# Patient Record
Sex: Female | Born: 1985 | Race: White | Hispanic: No | Marital: Married | Smoking: Former smoker
Health system: Southern US, Community
[De-identification: ages and names within clinical notes are randomized; demographics above are authoritative.]

## PROBLEM LIST (undated history)

## (undated) DIAGNOSIS — C439 Malignant melanoma of skin, unspecified: Secondary | ICD-10-CM

## (undated) DIAGNOSIS — R55 Syncope and collapse: Secondary | ICD-10-CM

## (undated) DIAGNOSIS — G43909 Migraine, unspecified, not intractable, without status migrainosus: Secondary | ICD-10-CM

## (undated) DIAGNOSIS — B019 Varicella without complication: Secondary | ICD-10-CM

## (undated) HISTORY — DX: Malignant melanoma of skin, unspecified: C43.9

## (undated) HISTORY — DX: Syncope and collapse: R55

## (undated) HISTORY — DX: Varicella without complication: B01.9

## (undated) HISTORY — DX: Migraine, unspecified, not intractable, without status migrainosus: G43.909

## (undated) HISTORY — PX: CHOLECYSTECTOMY: SHX55

---
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
## 2005-07-07 ENCOUNTER — Emergency Department: Payer: Self-pay | Admitting: Internal Medicine

## 2005-07-14 ENCOUNTER — Emergency Department: Payer: Self-pay | Admitting: General Practice

## 2005-09-07 ENCOUNTER — Emergency Department: Payer: Self-pay | Admitting: Emergency Medicine

## 2006-01-24 ENCOUNTER — Observation Stay: Payer: Self-pay | Admitting: Obstetrics & Gynecology

## 2006-01-29 ENCOUNTER — Observation Stay: Payer: Self-pay | Admitting: Obstetrics and Gynecology

## 2006-02-06 ENCOUNTER — Inpatient Hospital Stay: Payer: Self-pay | Admitting: Obstetrics & Gynecology

## 2006-11-17 ENCOUNTER — Emergency Department: Payer: Self-pay | Admitting: Emergency Medicine

## 2006-12-26 ENCOUNTER — Ambulatory Visit: Payer: Self-pay | Admitting: Obstetrics and Gynecology

## 2007-02-09 ENCOUNTER — Emergency Department: Payer: Self-pay

## 2007-06-12 ENCOUNTER — Ambulatory Visit: Payer: Self-pay | Admitting: Obstetrics and Gynecology

## 2007-06-13 ENCOUNTER — Inpatient Hospital Stay: Payer: Self-pay | Admitting: Obstetrics and Gynecology

## 2007-06-23 ENCOUNTER — Other Ambulatory Visit: Payer: Self-pay

## 2007-06-23 ENCOUNTER — Emergency Department: Payer: Self-pay | Admitting: Internal Medicine

## 2008-04-11 ENCOUNTER — Emergency Department: Payer: Self-pay | Admitting: Emergency Medicine

## 2008-12-18 ENCOUNTER — Emergency Department: Payer: Self-pay | Admitting: Internal Medicine

## 2011-11-26 ENCOUNTER — Ambulatory Visit (INDEPENDENT_AMBULATORY_CARE_PROVIDER_SITE_OTHER): Payer: Self-pay | Admitting: Internal Medicine

## 2011-11-26 ENCOUNTER — Encounter: Payer: Self-pay | Admitting: Internal Medicine

## 2011-11-26 VITALS — BP 110/80 | HR 64 | Temp 98.8°F | Ht 66.5 in | Wt 155.5 lb

## 2011-11-26 DIAGNOSIS — N926 Irregular menstruation, unspecified: Secondary | ICD-10-CM

## 2011-11-26 DIAGNOSIS — R002 Palpitations: Secondary | ICD-10-CM

## 2011-11-26 DIAGNOSIS — N920 Excessive and frequent menstruation with regular cycle: Secondary | ICD-10-CM

## 2011-11-26 DIAGNOSIS — Z0000 Encounter for general adult medical examination without abnormal findings: Secondary | ICD-10-CM

## 2011-11-26 NOTE — Assessment & Plan Note (Signed)
Patient having some palpitations at night. Exam and EKG are normal today. Will check TSH with labs. Will also check electrolytes given recent significant weight loss.

## 2011-11-26 NOTE — Progress Notes (Signed)
Subjective:    Patient ID: Angela Banks, female    DOB: January 30, 1986, 26 y.o.   MRN: [id number]  HPI 26 year old female presents to establish care. She reports that she has generally been healthy. She notes that over the last few months she has had some episodes of feeling lightheaded. She also notes some palpitations mostly at night time. These are brief and are not accompanied by shortness of breath, chest pain, or other symptoms. She notes that over the last 6 months as she has lost approximately 80 pounds. She has been following a healthy diet and has been exercising daily. She also notes some irregular menstrual cycles over the last few months. She notes that her periods are extremely heavy, typically requiring the use of both a tampon and pad every hour. Prior to her irregular menstrual cycles recently, her periods have been irregular. She denies symptoms such as hot flashes, vaginal dryness, fatigue.  No outpatient encounter prescriptions on file as of 11/26/2011.    Review of Systems  Constitutional: Negative for fever, chills, appetite change, fatigue and unexpected weight change.  HENT: Negative for ear pain, congestion, sore throat, trouble swallowing, neck pain, voice change and sinus pressure.   Eyes: Negative for visual disturbance.  Respiratory: Negative for cough, shortness of breath, wheezing and stridor.   Cardiovascular: Positive for palpitations. Negative for chest pain and leg swelling.  Gastrointestinal: Negative for nausea, vomiting, abdominal pain, diarrhea, constipation, blood in stool, abdominal distention and anal bleeding.  Genitourinary: Positive for menstrual problem. Negative for dysuria and flank pain.  Musculoskeletal: Negative for myalgias, arthralgias and gait problem.  Skin: Negative for color change and rash.  Neurological: Positive for light-headedness. Negative for dizziness and headaches.  Hematological: Negative for adenopathy. Does not bruise/bleed  easily.  Psychiatric/Behavioral: Negative for suicidal ideas, disturbed wake/sleep cycle and dysphoric mood. The patient is not nervous/anxious.    BP 110/80  Pulse 64  Temp 98.8 F (37.1 C) (Oral)  Ht 5' 6.5" (1.689 m)  Wt 155 lb 8 oz (70.534 kg)  BMI 24.72 kg/m2  SpO2 98%  LMP 09/19/2011     Objective:   Physical Exam  Constitutional: She is oriented to person, place, and time. She appears well-developed and well-nourished. No distress.  HENT:  Head: Normocephalic and atraumatic.  Right Ear: External ear normal.  Left Ear: External ear normal.  Nose: Nose normal.  Mouth/Throat: Oropharynx is clear and moist. No oropharyngeal exudate.  Eyes: Conjunctivae are normal. Pupils are equal, round, and reactive to light. Right eye exhibits no discharge. Left eye exhibits no discharge. No scleral icterus.  Neck: Normal range of motion. Neck supple. No tracheal deviation present. No thyromegaly present.  Cardiovascular: Normal rate, regular rhythm, normal heart sounds and intact distal pulses.  Exam reveals no gallop and no friction rub.   No murmur heard. Pulmonary/Chest: Effort normal and breath sounds normal. No respiratory distress. She has no wheezes. She has no rales. She exhibits no tenderness.  Abdominal: Soft. Bowel sounds are normal. She exhibits no distension and no mass. There is no tenderness. There is no guarding.  Musculoskeletal: Normal range of motion. She exhibits no edema and no tenderness.  Lymphadenopathy:    She has no cervical adenopathy.  Neurological: She is alert and oriented to person, place, and time. No cranial nerve deficit. She exhibits normal muscle tone. Coordination normal.  Skin: Skin is warm and dry. No rash noted. She is not diaphoretic. No erythema. No pallor.  Psychiatric: She has a normal  mood and affect. Her behavior is normal. Judgment and thought content normal.          Assessment & Plan:

## 2011-11-26 NOTE — Assessment & Plan Note (Signed)
Patient having extremely heavy menstrual cycles. Cycles also irregular. As above, checking TSH, LH, FSH. Will also get ultrasound of the pelvis to evaluate for uterine fibroid. Will check CBC and ferritin with labs.

## 2011-11-26 NOTE — Assessment & Plan Note (Signed)
Menses irregular. Question if this may be related to recent significant weight loss. Alternative considerations would be thyroid dysfunction. Will check TSH, LH, FSH with labs.

## 2011-11-27 LAB — LUTEINIZING HORMONE: LH: 13.31 m[IU]/mL

## 2011-11-27 LAB — CBC WITH DIFFERENTIAL/PLATELET
Basophils Relative: 0.2 % (ref 0.0–3.0)
Eosinophils Absolute: 0.1 10*3/uL (ref 0.0–0.7)
Hemoglobin: 12.8 g/dL (ref 12.0–15.0)
Lymphocytes Relative: 30.9 % (ref 12.0–46.0)
MCHC: 33.7 g/dL (ref 30.0–36.0)
Monocytes Relative: 6.7 % (ref 3.0–12.0)
Neutro Abs: 3.5 10*3/uL (ref 1.4–7.7)
RBC: 4.07 Mil/uL (ref 3.87–5.11)

## 2011-11-27 LAB — COMPREHENSIVE METABOLIC PANEL
BUN: 17 mg/dL (ref 6–23)
CO2: 26 mEq/L (ref 19–32)
Calcium: 9.2 mg/dL (ref 8.4–10.5)
Chloride: 105 mEq/L (ref 96–112)
Creatinine, Ser: 0.7 mg/dL (ref 0.4–1.2)
GFR: 103.85 mL/min (ref >60.00)

## 2011-11-27 LAB — FERRITIN: Ferritin: 13.7 ng/mL (ref 10.0–291.0)

## 2011-11-27 LAB — LIPID PANEL
Cholesterol: 167 mg/dL (ref 0–200)
Triglycerides: 48 mg/dL (ref 0.0–149.0)

## 2011-11-30 ENCOUNTER — Ambulatory Visit: Payer: Self-pay | Admitting: Internal Medicine

## 2011-12-03 ENCOUNTER — Telehealth: Payer: Self-pay | Admitting: Internal Medicine

## 2011-12-03 NOTE — Telephone Encounter (Signed)
Ultrasound of the pelvis was normal except for a small 1.9 cm ovarian cyst on the right. No findings to explain irregular menses.

## 2011-12-03 NOTE — Telephone Encounter (Signed)
Patient advised as instructed via telephone, she will talk with Dr. Dan Humphreys more again on Friday at her follow up appt.

## 2011-12-07 ENCOUNTER — Ambulatory Visit: Payer: 59 | Admitting: Internal Medicine

## 2011-12-07 DIAGNOSIS — Z0289 Encounter for other administrative examinations: Secondary | ICD-10-CM

## 2012-02-08 ENCOUNTER — Ambulatory Visit: Payer: Self-pay | Admitting: Internal Medicine

## 2012-02-08 LAB — LIPASE, BLOOD: Lipase: 84 U/L (ref 73–393)

## 2012-02-08 LAB — CBC WITH DIFFERENTIAL/PLATELET
Basophil #: 0 10*3/uL (ref 0.0–0.1)
Eosinophil #: 0.1 10*3/uL (ref 0.0–0.7)
HCT: 38.2 % (ref 35.0–47.0)
HGB: 12.7 g/dL (ref 12.0–16.0)
Lymphocyte #: 2.1 10*3/uL (ref 1.0–3.6)
Monocyte #: 0.7 x10 3/mm (ref 0.2–0.9)
Neutrophil #: 4.2 10*3/uL (ref 1.4–6.5)
RBC: 4.06 10*6/uL (ref 3.80–5.20)

## 2012-02-08 LAB — URINALYSIS, COMPLETE
Bilirubin,UR: NEGATIVE
Glucose,UR: NEGATIVE mg/dL (ref 0–75)
Ketone: NEGATIVE
Leukocyte Esterase: NEGATIVE
Nitrite: NEGATIVE
Specific Gravity: >=1.03 (ref 1.003–1.030)
WBC UR: NONE SEEN /HPF (ref 0–5)

## 2012-02-08 LAB — COMPREHENSIVE METABOLIC PANEL
Albumin: 4 g/dL (ref 3.4–5.0)
Alkaline Phosphatase: 75 U/L (ref 50–136)
BUN: 13 mg/dL (ref 7–18)
Glucose: 85 mg/dL (ref 65–99)
Potassium: 4 mmol/L (ref 3.5–5.1)
SGOT(AST): 13 U/L — ABNORMAL LOW (ref 15–37)
SGPT (ALT): 25 U/L (ref 12–78)
Sodium: 139 mmol/L (ref 136–145)
Total Protein: 7.5 g/dL (ref 6.4–8.2)

## 2012-02-08 LAB — PREGNANCY, URINE: Pregnancy Test, Urine: NEGATIVE m[IU]/mL

## 2015-05-19 ENCOUNTER — Encounter: Payer: Self-pay | Admitting: Family

## 2015-05-19 ENCOUNTER — Ambulatory Visit: Payer: Self-pay | Admitting: Family

## 2015-05-19 VITALS — BP 100/70 | HR 67 | Temp 98.4°F

## 2015-05-19 DIAGNOSIS — J309 Allergic rhinitis, unspecified: Secondary | ICD-10-CM

## 2015-05-19 DIAGNOSIS — J329 Chronic sinusitis, unspecified: Secondary | ICD-10-CM

## 2015-05-19 MED ORDER — AZITHROMYCIN 250 MG PO TABS: ORAL_TABLET | ORAL | Status: DC

## 2015-05-19 MED ORDER — FLUTICASONE PROPIONATE 50 MCG/ACT NA SUSP: 2.0000 | Freq: Every day | NASAL | Status: DC

## 2015-05-19 MED ORDER — PREDNISONE 20 MG PO TABS: 40.0000 mg | ORAL_TABLET | Freq: Every day | ORAL | Status: DC

## 2015-05-19 NOTE — Progress Notes (Signed)
S/ 2 week hx of nasal congestion , seemed allergic as  started after going through Old stuff at Grandmothers house , now with severe sinus headache ,malaise , left max worse and left eye watering , not mattered. No fever or GI sxs Coworkers sick  O/ VSS alert pleasant , NAD ent  Nasal turbinates swollen , red occluded , left max tenderness , left conjunctiva injected and with watering , PERRLA   Pharynx clear neck supple heart rsr lungs clear A/ allergic rhinitis ,sinusitis  P/ Zpack, pred pulse, flonase rx sent supportive measures , rest hydrate .

## 2015-08-04 ENCOUNTER — Encounter: Payer: Self-pay | Admitting: Physician Assistant

## 2015-08-04 ENCOUNTER — Ambulatory Visit: Payer: Self-pay | Admitting: Physician Assistant

## 2015-08-04 VITALS — BP 110/60 | HR 73 | Temp 97.8°F

## 2015-08-04 DIAGNOSIS — B349 Viral infection, unspecified: Secondary | ICD-10-CM

## 2015-08-04 DIAGNOSIS — A084 Viral intestinal infection, unspecified: Secondary | ICD-10-CM

## 2015-08-04 NOTE — Progress Notes (Signed)
S:  Pt c/o sore throat and diarrhea, sx for 1 day, no fever/chills, no abd pain except for cramping with diarrhea; denies cp/sob, denies camping, bad food, recent antibiotics, or exposure to bad water, children have been sick with the same Remainder ros neg  O:  Vitals wnl, nad, ENT wnl, neck supple no lymph, lungs c t a, cv rrr, abd soft nontender bs normal all 4 quads, neuro intact  A:  Viral gastroenteritis, viral illness  P:  Reassurance, fluids, brat diet, immodium ad for diarrhea if needed, rx phenergan 25mg  tid prn vomiting, return if not better in 3 days, return earlier if worsening

## 2015-08-19 ENCOUNTER — Other Ambulatory Visit: Payer: Self-pay

## 2016-05-15 ENCOUNTER — Ambulatory Visit: Payer: Self-pay | Admitting: Physician Assistant

## 2016-05-15 ENCOUNTER — Encounter: Payer: Self-pay | Admitting: Physician Assistant

## 2016-05-15 VITALS — BP 127/81 | HR 93 | Temp 98.2°F

## 2016-05-15 DIAGNOSIS — J069 Acute upper respiratory infection, unspecified: Secondary | ICD-10-CM

## 2016-05-15 LAB — POCT INFLUENZA A/B
INFLUENZA B, POC: NEGATIVE
Influenza A, POC: NEGATIVE

## 2016-05-15 MED ORDER — HYDROCOD POLST-CPM POLST ER 10-8 MG/5ML PO SUER: 5.0000 mL | Freq: Two times a day (BID) | ORAL | 0 refills | Status: DC | PRN

## 2016-05-15 NOTE — Progress Notes (Signed)
S: C/o runny nose and congestion with dry cough for 3 days, + fever, chills that started yesterday, denies cp/sob, v/d; mucus was green this am , cough is sporadic, deep and hacking, keeping her awake at night  Using otc meds: nyquil  O: PE: vitals wnl, nad,  perrl eomi, normocephalic, tms dull, nasal mucosa red and swollen, throat injected, neck supple no lymph, lungs c t a, cv rrr, neuro intact, flu swab neg  A:  Acute flu like illness   P: drink fluids, continue regular meds , use otc meds of choice, return if not improving in 5 days, return earlier if worsening , tussionex, zpack, pred 30mg  qd x 3d

## 2016-05-17 ENCOUNTER — Encounter: Payer: Self-pay | Admitting: Physician Assistant

## 2016-05-17 ENCOUNTER — Ambulatory Visit: Payer: Self-pay | Admitting: Physician Assistant

## 2016-05-17 VITALS — BP 121/59 | HR 78 | Temp 97.7°F

## 2016-05-17 DIAGNOSIS — B349 Viral infection, unspecified: Secondary | ICD-10-CM

## 2016-05-17 NOTE — Progress Notes (Signed)
S:  Pt c/o vomiting, sx for 1 day, no fever/chills, no abd pain except for cramping with diarrhea; denies cp/sob, denies camping, bad food, or exposure to bad water Remainder ros neg, been on zpack for uri, takes last pill tomorrow, still has headache and pressure, no diarrhea yet, kids have norovirus  O:  Vitals wnl, nad, ENT wnl, neck supple no lymph, lungs c t a, cv rrr, abd soft nontender bs increased lower quads b/l, neuro intact  A:  Viral illness  P:  Reassurance, fluids, brat diet, immodium ad for diarrhea if needed, return if not better in 3 days, return earlier if worsening, work note given for yesterday and today as pt appears ill

## 2016-10-08 ENCOUNTER — Ambulatory Visit: Payer: Self-pay | Admitting: Physician Assistant

## 2016-10-08 ENCOUNTER — Encounter: Payer: Self-pay | Admitting: Physician Assistant

## 2016-10-08 ENCOUNTER — Encounter (INDEPENDENT_AMBULATORY_CARE_PROVIDER_SITE_OTHER): Payer: Self-pay

## 2016-10-08 VITALS — BP 110/70 | HR 76 | Temp 98.5°F | Ht 66.0 in | Wt 223.0 lb

## 2016-10-08 DIAGNOSIS — Z008 Encounter for other general examination: Secondary | ICD-10-CM

## 2016-10-08 DIAGNOSIS — Z0000 Encounter for general adult medical examination without abnormal findings: Secondary | ICD-10-CM

## 2016-10-08 DIAGNOSIS — Z0189 Encounter for other specified special examinations: Principal | ICD-10-CM

## 2016-10-08 NOTE — Progress Notes (Signed)
S: pt here for wellness physical and biometrics for insurance purposes, no complaints ros neg. PMH: neg   Social: smoker Fam: as noted on chart  O: vitals wnl, nad, ENT wnl, neck supple no lymph, lungs c t a, cv rrr, abd soft nontender bs normal all 4 quads  A: wellness, biometric physical  P: labs today, will forward to dr Toya Smothersknowles jonas as she was suppose to get labs via her but did not get it done

## 2016-10-09 LAB — CMP12+LP+TP+TSH+6AC+CBC/D/PLT
A/G RATIO: 1.9 (ref 1.2–2.2)
ALT: 17 IU/L (ref 0–32)
AST: 16 IU/L (ref 0–40)
Albumin: 4.5 g/dL (ref 3.5–5.5)
Alkaline Phosphatase: 85 IU/L (ref 39–117)
BUN/Creatinine Ratio: 13 (ref 9–23)
BUN: 10 mg/dL (ref 6–20)
Basophils Absolute: 0 10*3/uL (ref 0.0–0.2)
Basos: 1 %
Bilirubin Total: 0.3 mg/dL (ref 0.0–1.2)
CHOL/HDL RATIO: 2.7 ratio (ref 0.0–4.4)
CREATININE: 0.77 mg/dL (ref 0.57–1.00)
Calcium: 9.2 mg/dL (ref 8.7–10.2)
Chloride: 104 mmol/L (ref 96–106)
Cholesterol, Total: 156 mg/dL (ref 100–199)
EOS (ABSOLUTE): 0.2 10*3/uL (ref 0.0–0.4)
Eos: 3 %
Estimated CHD Risk: <0.5 times avg. (ref 0.0–1.0)
Free Thyroxine Index: 2.1 (ref 1.2–4.9)
GFR, EST AFRICAN AMERICAN: 119 mL/min/{1.73_m2} (ref >59)
GFR, EST NON AFRICAN AMERICAN: 103 mL/min/{1.73_m2} (ref >59)
GGT: 14 IU/L (ref 0–60)
GLOBULIN, TOTAL: 2.4 g/dL (ref 1.5–4.5)
GLUCOSE: 101 mg/dL — AB (ref 65–99)
HDL: 58 mg/dL (ref >39)
Hematocrit: 42.8 % (ref 34.0–46.6)
Hemoglobin: 13.6 g/dL (ref 11.1–15.9)
IMMATURE GRANS (ABS): 0 10*3/uL (ref 0.0–0.1)
IRON: 59 ug/dL (ref 27–159)
Immature Granulocytes: 0 %
LDH: 212 IU/L (ref 119–226)
LDL Calculated: 76 mg/dL (ref 0–99)
LYMPHS ABS: 2.1 10*3/uL (ref 0.7–3.1)
Lymphs: 33 %
MCH: 29.6 pg (ref 26.6–33.0)
MCHC: 31.8 g/dL (ref 31.5–35.7)
MCV: 93 fL (ref 79–97)
MONOS ABS: 0.5 10*3/uL (ref 0.1–0.9)
Monocytes: 7 %
NEUTROS ABS: 3.6 10*3/uL (ref 1.4–7.0)
Neutrophils: 56 %
PHOSPHORUS: 3.6 mg/dL (ref 2.5–4.5)
POTASSIUM: 4.2 mmol/L (ref 3.5–5.2)
Platelets: 258 10*3/uL (ref 150–379)
RBC: 4.59 x10E6/uL (ref 3.77–5.28)
RDW: 13.2 % (ref 12.3–15.4)
SODIUM: 139 mmol/L (ref 134–144)
T3 Uptake Ratio: 26 % (ref 24–39)
T4 TOTAL: 7.9 ug/dL (ref 4.5–12.0)
TSH: 1.61 u[IU]/mL (ref 0.450–4.500)
Total Protein: 6.9 g/dL (ref 6.0–8.5)
Triglycerides: 111 mg/dL (ref 0–149)
URIC ACID: 5.3 mg/dL (ref 2.5–7.1)
VLDL Cholesterol Cal: 22 mg/dL (ref 5–40)
WBC: 6.4 10*3/uL (ref 3.4–10.8)

## 2016-10-09 LAB — VITAMIN D 25 HYDROXY (VIT D DEFICIENCY, FRACTURES): Vit D, 25-Hydroxy: 42.5 ng/mL (ref 30.0–100.0)

## 2017-02-15 ENCOUNTER — Encounter: Payer: Self-pay | Admitting: Physician Assistant

## 2017-02-15 ENCOUNTER — Ambulatory Visit: Payer: Self-pay | Admitting: Physician Assistant

## 2017-02-15 VITALS — BP 110/70 | HR 72 | Temp 97.9°F | Resp 16

## 2017-02-15 DIAGNOSIS — R51 Headache: Secondary | ICD-10-CM

## 2017-02-15 DIAGNOSIS — J0100 Acute maxillary sinusitis, unspecified: Secondary | ICD-10-CM

## 2017-02-15 DIAGNOSIS — R519 Headache, unspecified: Secondary | ICD-10-CM

## 2017-02-15 MED ORDER — PREDNISONE 10 MG PO TABS: 30.0000 mg | ORAL_TABLET | Freq: Every day | ORAL | 0 refills | Status: DC

## 2017-02-15 MED ORDER — AZITHROMYCIN 250 MG PO TABS: ORAL_TABLET | ORAL | 0 refills | Status: DC

## 2017-02-15 NOTE — Progress Notes (Signed)
S: C/o runny nose and congestion for 3 days, no fever, chills, cp/sob, v/d; mucus is green and thick, cough is sporadic, c/o of facial and dental pain. Pressure behind eyes, sinus headache  Using otc meds: nyquil, cold meds  O: PE: vitals wnl, nad, perrl eomi, normocephalic, tms dull, nasal mucosa red and swollen, throat injected, neck supple no lymph, lungs c t a, cv rrr, neuro intact  A:  Acute sinusitis   P: drink fluids, continue regular meds , use otc meds of choice, return if not improving in 5 days, return earlier if worsening , zpack, pred  qd x 3d

## 2017-03-04 ENCOUNTER — Telehealth: Payer: Self-pay | Admitting: Emergency Medicine

## 2017-03-04 MED ORDER — FLUCONAZOLE 150 MG PO TABS: ORAL_TABLET | ORAL | 0 refills | Status: DC

## 2017-03-04 NOTE — Telephone Encounter (Signed)
Patient called and expressed that she has developed a yeast infection due to the antibiotics she was prescribed.

## 2017-03-04 NOTE — Telephone Encounter (Signed)
Pt developed yeast from the antibiotic, sent rx for diflucan to her pharmacy

## 2017-03-22 ENCOUNTER — Ambulatory Visit: Payer: Self-pay | Admitting: Physician Assistant

## 2017-03-22 DIAGNOSIS — Z0289 Encounter for other administrative examinations: Secondary | ICD-10-CM

## 2017-03-22 NOTE — Progress Notes (Signed)
Here for c-com physical, see documentaion in systoc

## 2017-05-20 ENCOUNTER — Encounter: Payer: Self-pay | Admitting: Emergency Medicine

## 2017-05-20 ENCOUNTER — Ambulatory Visit: Payer: Self-pay | Admitting: Emergency Medicine

## 2017-05-20 VITALS — BP 110/70 | HR 69 | Temp 97.7°F | Resp 16

## 2017-05-20 DIAGNOSIS — J0100 Acute maxillary sinusitis, unspecified: Secondary | ICD-10-CM

## 2017-05-20 MED ORDER — PREDNISONE 10 MG (21) PO TBPK: ORAL_TABLET | ORAL | 0 refills | Status: DC

## 2017-05-20 MED ORDER — FLUCONAZOLE 150 MG PO TABS: 150.0000 mg | ORAL_TABLET | Freq: Once | ORAL | 0 refills | Status: AC

## 2017-05-20 MED ORDER — AMOXICILLIN 875 MG PO TABS: 875.0000 mg | ORAL_TABLET | Freq: Two times a day (BID) | ORAL | 0 refills | Status: DC

## 2017-05-20 NOTE — Progress Notes (Signed)
Subjective.  Patient had onset of symptoms on Friday. She has significant head congestion. She has severe facial pressure. She alternates between clear and colored nasal drainage. Her biggest problem has been the pain in her face. Her throat is scratchy. She does have a cough but it is not severe. She denies any severe fever or chills.  Objective.  HEENT exam. Pupils equal and reactive to light with clear conjunctival discharge. Nose is congested no purulence There is significant discomfort with tapping over both maxillary sinuses but worse on the right. TMs are all with fluid. Neck is supple no adenopathy. Chest clear to auscultation and percussion.  Assessment.  Patient has significant nasal congestion with developing sinus infection.  Plan.  Amoxicillin 875 twice a day. Sterapred DS six-day. Patient given Diflucan prescription.

## 2018-07-22 ENCOUNTER — Other Ambulatory Visit: Payer: Self-pay | Admitting: Ophthalmology

## 2018-07-22 DIAGNOSIS — H47033 Optic nerve hypoplasia, bilateral: Secondary | ICD-10-CM

## 2019-03-20 ENCOUNTER — Encounter: Payer: Self-pay | Admitting: Medical

## 2019-03-20 ENCOUNTER — Other Ambulatory Visit: Payer: Self-pay

## 2019-03-20 ENCOUNTER — Ambulatory Visit: Payer: Self-pay | Admitting: Medical

## 2019-03-20 DIAGNOSIS — B373 Candidiasis of vulva and vagina: Secondary | ICD-10-CM

## 2019-03-20 DIAGNOSIS — R05 Cough: Secondary | ICD-10-CM

## 2019-03-20 DIAGNOSIS — B3731 Acute candidiasis of vulva and vagina: Secondary | ICD-10-CM

## 2019-03-20 DIAGNOSIS — J329 Chronic sinusitis, unspecified: Secondary | ICD-10-CM

## 2019-03-20 DIAGNOSIS — R059 Cough, unspecified: Secondary | ICD-10-CM

## 2019-03-20 DIAGNOSIS — J069 Acute upper respiratory infection, unspecified: Secondary | ICD-10-CM

## 2019-03-20 MED ORDER — AMOXICILLIN-POT CLAVULANATE 875-125 MG PO TABS: 1.0000 | ORAL_TABLET | Freq: Two times a day (BID) | ORAL | 0 refills | Status: DC

## 2019-03-20 MED ORDER — FLUCONAZOLE 150 MG PO TABS: 150.0000 mg | ORAL_TABLET | Freq: Once | ORAL | 0 refills | Status: AC

## 2019-03-20 MED ORDER — BENZONATATE 200 MG PO CAPS: 200.0000 mg | ORAL_CAPSULE | Freq: Three times a day (TID) | ORAL | 0 refills | Status: DC | PRN

## 2019-03-20 NOTE — Progress Notes (Signed)
Permission to treat through telemedicine .   Started  Last Tuesday with chills and a scratchy throat and a dry cough. Woke up sweaty that night,  checked temperature 103.5. Tested for Covid on Wednesday and had negative results. ST increasingly worse , head pressure and nasal congestion cough yellow to clear. Deep breaths more difficult.  Wednesday night  102.5 temp. Ear pressure bilaterally .  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 Not pregnant.  No N/V/D.  Cholecystectomy 2006 C-Sec 2007.   PE: none done due to telemedicine appointment.  Dx/Plan  Sinusitis , Upper Respiratory Infection , Cough Yeast vaginitis with antibiotic use Meds ordered this encounter  Medications  . amoxicillin-clavulanate (AUGMENTIN) 875-125 MG tablet    Sig: Take 1 tablet by mouth 2 (two) times daily.    Dispense:  20 tablet    Refill:  0  . benzonatate (TESSALON) 200 MG capsule    Sig: Take 1 capsule (200 mg total) by mouth 3 (three) times daily as needed for cough.    Dispense:  20 capsule    Refill:  0  . fluconazole (DIFLUCAN) 150 MG tablet    Sig: Take 1 tablet (150 mg total) by mouth once for 1 dose. Take after finishing antibiotics. For yeast vaginitis.    Dispense:  1 tablet    Refill:  0  follow up  3-5 days if  Not improving. Work note written and patient should have access to note.  She needs to be fever free x 24 hours and 10 days from symptom onset with symptoms resolved or improving.  Patient verbalizes understanding and has no questions at discharge.

## 2020-03-30 DIAGNOSIS — M5430 Sciatica, unspecified side: Secondary | ICD-10-CM | POA: Diagnosis not present

## 2020-11-15 DIAGNOSIS — M533 Sacrococcygeal disorders, not elsewhere classified: Secondary | ICD-10-CM | POA: Diagnosis not present

## 2021-08-16 DIAGNOSIS — N6452 Nipple discharge: Secondary | ICD-10-CM | POA: Insufficient documentation

## 2021-08-24 ENCOUNTER — Encounter: Payer: Self-pay | Admitting: Family

## 2021-08-24 ENCOUNTER — Ambulatory Visit: Payer: BC Managed Care – PPO | Admitting: Family

## 2021-08-24 ENCOUNTER — Other Ambulatory Visit: Payer: Self-pay | Admitting: Family

## 2021-08-24 ENCOUNTER — Telehealth: Payer: Self-pay | Admitting: Family

## 2021-08-24 VITALS — BP 108/60 | HR 73 | Temp 98.6°F | Resp 16 | Ht 66.0 in | Wt 256.1 lb

## 2021-08-24 DIAGNOSIS — Z13220 Encounter for screening for lipoid disorders: Secondary | ICD-10-CM | POA: Insufficient documentation

## 2021-08-24 DIAGNOSIS — N6311 Unspecified lump in the right breast, upper outer quadrant: Secondary | ICD-10-CM | POA: Insufficient documentation

## 2021-08-24 DIAGNOSIS — E282 Polycystic ovarian syndrome: Secondary | ICD-10-CM | POA: Diagnosis not present

## 2021-08-24 DIAGNOSIS — N6452 Nipple discharge: Secondary | ICD-10-CM

## 2021-08-24 DIAGNOSIS — N6324 Unspecified lump in the left breast, lower inner quadrant: Secondary | ICD-10-CM

## 2021-08-24 DIAGNOSIS — R0689 Other abnormalities of breathing: Secondary | ICD-10-CM | POA: Insufficient documentation

## 2021-08-24 DIAGNOSIS — N6321 Unspecified lump in the left breast, upper outer quadrant: Secondary | ICD-10-CM | POA: Insufficient documentation

## 2021-08-24 DIAGNOSIS — R0683 Snoring: Secondary | ICD-10-CM | POA: Diagnosis not present

## 2021-08-24 DIAGNOSIS — E559 Vitamin D deficiency, unspecified: Secondary | ICD-10-CM

## 2021-08-24 DIAGNOSIS — R5383 Other fatigue: Secondary | ICD-10-CM | POA: Diagnosis not present

## 2021-08-24 HISTORY — DX: Other abnormalities of breathing: R06.89

## 2021-08-24 HISTORY — DX: Unspecified lump in the right breast, upper outer quadrant: N63.11

## 2021-08-24 LAB — COMPREHENSIVE METABOLIC PANEL
ALT: 31 U/L (ref 0–35)
AST: 22 U/L (ref 0–37)
Albumin: 4.5 g/dL (ref 3.5–5.2)
Alkaline Phosphatase: 77 U/L (ref 39–117)
BUN: 13 mg/dL (ref 6–23)
CO2: 29 mEq/L (ref 19–32)
Calcium: 9.4 mg/dL (ref 8.4–10.5)
Chloride: 104 mEq/L (ref 96–112)
Creatinine, Ser: 0.76 mg/dL (ref 0.40–1.20)
GFR: 101.1 mL/min (ref >60.00)
Glucose, Bld: 86 mg/dL (ref 70–99)
Potassium: 4.5 mEq/L (ref 3.5–5.1)
Sodium: 140 mEq/L (ref 135–145)
Total Bilirubin: 0.6 mg/dL (ref 0.2–1.2)
Total Protein: 6.9 g/dL (ref 6.0–8.3)

## 2021-08-24 LAB — T4, FREE: Free T4: 0.83 ng/dL (ref 0.60–1.60)

## 2021-08-24 LAB — LIPID PANEL
Cholesterol: 192 mg/dL (ref 0–200)
HDL: 61.5 mg/dL (ref >39.00)
LDL Cholesterol: 115 mg/dL — ABNORMAL HIGH (ref 0–99)
NonHDL: 130.52
Total CHOL/HDL Ratio: 3
Triglycerides: 79 mg/dL (ref 0.0–149.0)
VLDL: 15.8 mg/dL (ref 0.0–40.0)

## 2021-08-24 LAB — CBC WITH DIFFERENTIAL/PLATELET
Basophils Absolute: 0 10*3/uL (ref 0.0–0.1)
Basophils Relative: 0.7 % (ref 0.0–3.0)
Eosinophils Absolute: 0.1 10*3/uL (ref 0.0–0.7)
Eosinophils Relative: 1.3 % (ref 0.0–5.0)
HCT: 42.9 % (ref 36.0–46.0)
Hemoglobin: 14.1 g/dL (ref 12.0–15.0)
Lymphocytes Relative: 31 % (ref 12.0–46.0)
Lymphs Abs: 2 10*3/uL (ref 0.7–4.0)
MCHC: 32.9 g/dL (ref 30.0–36.0)
MCV: 90.4 fl (ref 78.0–100.0)
Monocytes Absolute: 0.5 10*3/uL (ref 0.1–1.0)
Monocytes Relative: 8.2 % (ref 3.0–12.0)
Neutro Abs: 3.8 10*3/uL (ref 1.4–7.7)
Neutrophils Relative %: 58.8 % (ref 43.0–77.0)
Platelets: 245 10*3/uL (ref 150.0–400.0)
RBC: 4.74 Mil/uL (ref 3.87–5.11)
RDW: 13.5 % (ref 11.5–15.5)
WBC: 6.5 10*3/uL (ref 4.0–10.5)

## 2021-08-24 LAB — VITAMIN D 25 HYDROXY (VIT D DEFICIENCY, FRACTURES): VITD: 26.7 ng/mL — ABNORMAL LOW (ref 30.00–100.00)

## 2021-08-24 LAB — TSH: TSH: 1.74 u[IU]/mL (ref 0.35–5.50)

## 2021-08-24 LAB — HEMOGLOBIN A1C: Hgb A1c MFr Bld: 5.9 % (ref 4.6–6.5)

## 2021-08-24 LAB — B12 AND FOLATE PANEL
Folate: 12.4 ng/mL (ref >5.9)
Vitamin B-12: 203 pg/mL — ABNORMAL LOW (ref 211–911)

## 2021-08-24 LAB — T3, FREE: T3, Free: 3.2 pg/mL (ref 2.3–4.2)

## 2021-08-24 MED ORDER — AMOXICILLIN-POT CLAVULANATE 875-125 MG PO TABS: 1.0000 | ORAL_TABLET | Freq: Two times a day (BID) | ORAL | 0 refills | Status: DC

## 2021-08-24 MED ORDER — VITAMIN D (ERGOCALCIFEROL) 1.25 MG (50000 UNIT) PO CAPS: 50000.0000 [IU] | ORAL_CAPSULE | ORAL | 0 refills | Status: AC

## 2021-08-24 NOTE — Progress Notes (Signed)
? ?New Patient Office Visit ? ?Subjective:  ?Patient ID: Angela Banks, female    DOB: [birth year]  Age: 36 y.o. MRN: [id number] ? ?CC:  ?Chief Complaint  ?Patient presents with  ? Establish Care  ? ? ?HPI ?Angela Banks is here to establish care as a new patient. ? ?Prior provider was: Bettyann Birchler back in 2013 ? [facility]  ?Pt is without acute concerns.  ? ?On the 29th of march noticing some discharge from both nipples as well as blood from the left breast. Discharge was grayish green discharge. Over the last week or so the left breast with brown discharge. When she is sexually active will notice more discharge. Not typical to use nipple stimulation with sexual activity.  ? She has two older children, 14 and 15.  ? She doesn't necessary feel any breast lumps, no rashes on breast. No pain under armpits. ? Did notice a few months ago her bil armpit lymph nodes were swollen and red/tender  ? To the touch but resolved about one week later.  ? ?Lately she has noticed when she is in a deep sleep she wakes up gasping for air. She blames it on sleep deprivation as increased stress. Primary caregiver working two jobs, has family two kids, and mom and mil with recent cancer diagnoses, bringing Mil to cancer treatments. On average sleep maybe 3-4 hours. 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  ? ?Abn weight gain, has a lot of stress as well however at least gained 30 pounds in the last one year.  ? ?Painful periods, has pcos. Has mulitple cysts. Has iud in place which helps control her periods and makes them more manageable.  ? ? ?Past Medical History:  ?Diagnosis Date  ? Chicken pox   ? Fainting spell   ? Migraine   ? ? ?Past Surgical History:  ?Procedure Laterality Date  ? CESAREAN SECTION  2009  ? tubal  ? CHOLECYSTECTOMY    ? VAGINAL DELIVERY    ? ? ?Family History  ?Problem Relation Age of Onset  ? Diabetes Mother   ? Ovarian cancer Mother   ?     stage 3  ? Arthritis  Maternal Grandmother   ? Heart disease Maternal Grandmother   ? Diabetes Maternal Grandmother   ? Kidney cancer Maternal Grandmother   ? Arthritis Maternal Grandfather   ? Heart disease Maternal Grandfather   ? Diabetes Maternal Grandfather   ? Colon cancer Paternal Grandmother   ? Uterine cancer Maternal Aunt   ? ? ?Social History  ? ?Socioeconomic History  ? Marital status: Married  ?  Spouse name: Not on file  ? Number of children: Not on file  ? Years of education: Not on file  ? Highest education level: Not on file  ?Occupational History  ? Not on file  ?Tobacco Use  ? Smoking status: Former  ?  Packs/day: 0.25  ?  Types: Cigarettes  ? Smokeless tobacco: Never  ?Substance and Sexual Activity  ? Alcohol use: No  ?  Alcohol/week: 0.0 standard drinks  ? Drug use: No  ? Sexual activity: Yes  ?  Partners: Male  ?  Birth control/protection: I.U.D.  ?Other Topics Concern  ? Not on file  ?Social History Narrative  ? Lives in [place] with husband and 2 children. 2 dogs in home.  ?   ? Work - Clinical cytogeneticist in [place]  ? Diet - healthy  ?  Exercise - home gym  ? ?Social Determinants of Health  ? ?Financial Resource Strain: Not on file  ?Food Insecurity: Not on file  ?Transportation Needs: Not on file  ?Physical Activity: Not on file  ?Stress: Not on file  ?Social Connections: Not on file  ?Intimate Partner Violence: Not on file  ? ? ?Outpatient Medications Prior to Visit  ?Medication Sig Dispense Refill  ? benzonatate (TESSALON) 200 MG capsule Take 1 capsule (200 mg total) by mouth 3 (three) times daily as needed for cough. 20 capsule 0  ? amoxicillin-clavulanate (AUGMENTIN) 875-125 MG tablet Take 1 tablet by mouth 2 (two) times daily. 20 tablet 0  ? ?No facility-administered medications prior to visit.  ? ? ?No Known Allergies ? ?ROS ?Review of Systems  ?Constitutional:  Positive for fatigue and unexpected weight change. Negative for chills and fever.  ?Eyes:  Negative for visual disturbance.  ?Respiratory:   Negative for shortness of breath.   ?Cardiovascular:  Negative for chest pain.  ?Gastrointestinal:  Negative for abdominal pain.  ?Genitourinary:  Positive for menstrual problem. Negative for difficulty urinating.  ?Skin:  Negative for rash.  ?Neurological:  Negative for dizziness and headaches.  ?Psychiatric/Behavioral:  Positive for sleep disturbance (gasping awake during sleep and snoring). Negative for suicidal ideas. The patient is nervous/anxious.   ?Breast ROS: positive for - new or changing breast lumps and nipple discharge ? ?  ?Objective:  ?  ?Physical Exam ? ?Breasts: breasts appear normal, no suspicious masses, no skin or nipple changes or axillary nodes, left breast with density and tenderness left upper outer quadrant inner axilla, left lower outer pinpoint tenderness and small density. Right breast with right upper outer quadrant density with tenderness. Dried blood on left nipple. Right breast with tenderness around nipple no discharge expressed. ? ?Gen: NAD, resting comfortably ?CV: RRR with no murmurs appreciated ?Pulm: NWOB, CTAB with no crackles, wheezes, or rhonchi ?Skin: warm, dry ?Psych: Normal affect and thought content ? ?BP 108/60   Pulse 73   Temp 98.6 ?F (37 ?C)   Resp 16   Ht 5\' 6"  (1.676 m)   Wt 256 lb 1 oz (116.1 kg)   SpO2 97%   BMI 41.33 kg/m?  ?Wt Readings from Last 3 Encounters:  ?08/24/21 256 lb 1 oz (116.1 kg)  ?10/08/16 223 lb (101.2 kg)  ?11/26/11 155 lb 8 oz (70.5 kg)  ? ? ? ?Health Maintenance Due  ?Topic Date Due  ? HIV Screening  Never done  ? Hepatitis C Screening  Never done  ? PAP SMEAR-Modifier  11/26/2010  ? TETANUS/TDAP  11/26/2019  ? COVID-19 Vaccine (2 - Pfizer series) 03/04/2020  ? ? ?There are no preventive care reminders to display for this patient. ? ?Lab Results  ?Component Value Date  ? TSH 1.74 08/24/2021  ? ?Lab Results  ?Component Value Date  ? WBC 6.5 08/24/2021  ? HGB 14.1 08/24/2021  ? HCT 42.9 08/24/2021  ? MCV 90.4 08/24/2021  ? PLT 245.0  08/24/2021  ? ?Lab Results  ?Component Value Date  ? NA 140 08/24/2021  ? K 4.5 08/24/2021  ? CO2 29 08/24/2021  ? GLUCOSE 86 08/24/2021  ? BUN 13 08/24/2021  ? CREATININE 0.76 08/24/2021  ? BILITOT 0.6 08/24/2021  ? ALKPHOS 77 08/24/2021  ? AST 22 08/24/2021  ? ALT 31 08/24/2021  ? PROT 6.9 08/24/2021  ? ALBUMIN 4.5 08/24/2021  ? CALCIUM 9.4 08/24/2021  ? ANIONGAP 8 02/08/2012  ? GFR 101.10 08/24/2021  ? ?Lab  Results  ?Component Value Date  ? CHOL 192 08/24/2021  ? ?Lab Results  ?Component Value Date  ? HDL 61.50 08/24/2021  ? ?Lab Results  ?Component Value Date  ? LDLCALC 115 (H) 08/24/2021  ? ?Lab Results  ?Component Value Date  ? TRIG 79.0 08/24/2021  ? ?Lab Results  ?Component Value Date  ? CHOLHDL 3 08/24/2021  ? ?Lab Results  ?Component Value Date  ? HGBA1C 5.9 08/24/2021  ? ? ?  ?Assessment & Plan:  ? ?Problem List Items Addressed This Visit   ? ?  ? Endocrine  ? PCOS (polycystic ovarian syndrome)  ? Relevant Orders  ? Hemoglobin A1c (Completed)  ?  ? Other  ? Nipple discharge in female  ?  Consider ordering prolactin  ?Ordering breast imaging on pt pending results ? ?  ?  ? Relevant Medications  ? amoxicillin-clavulanate (AUGMENTIN) 875-125 MG tablet  ? Other Relevant Orders  ? MM DIAG BREAST TOMO BILATERAL  ? Mass of upper outer quadrant of left breast  ?  Dx mammogram left breast and us left breast ordered pending results ?  ?  ? Relevant Orders  ? MM DIAG BREAST TOMO BILATERAL  ? Mass of lower inner quadrant of left breast  ?  Dx mammogram left breast and us left breast ordered pending results ?  ?  ? Relevant Orders  ? MM DIAG BREAST TOMO BILATERAL  ? Mass of upper outer quadrant of right breast  ?  Dx mammogram right breast and us right breast ordered pending results ?  ?  ? Relevant Orders  ? MM DIAG BREAST TOMO BILATERAL  ? Screening for lipoid disorders  ? Relevant Orders  ? Lipid panel (Completed)  ? Vitamin D deficiency  ?  Vitamin d ordered pending results ?  ?  ? Relevant Orders  ? VITAMIN D 25  Hydroxy (Vit-D Deficiency, Fractures) (Completed)  ? Other fatigue  ?  Ordered cbc cmp b12 folate and thyroid panel ?Pending results ?  ?  ? Relevant Orders  ? Thyroid Peroxidase Antibodies (TPO) (REFL)  ? T3, free (Com

## 2021-08-24 NOTE — Assessment & Plan Note (Signed)
Dx mammogram left breast and [place] left breast ordered pending results ?

## 2021-08-24 NOTE — Assessment & Plan Note (Signed)
Dx mammogram right breast and [place] right breast ordered pending results ?

## 2021-08-24 NOTE — Assessment & Plan Note (Addendum)
Referral to sleep medicine ?eval for sleep study ? ?

## 2021-08-24 NOTE — Assessment & Plan Note (Addendum)
Ordered cbc cmp b12 folate and thyroid panel ?Pending results ?

## 2021-08-24 NOTE — Assessment & Plan Note (Signed)
Consider ordering prolactin  ?Ordering breast imaging on pt pending results ? ?

## 2021-08-24 NOTE — Assessment & Plan Note (Signed)
Vitamin d ordered pending results ? ?

## 2021-08-24 NOTE — Telephone Encounter (Signed)
Pt called stating that she was seen today and that tabitha referred her to [facility] for a mammogram and Ultra Sound. Pt states when she called to make an appointment they told her that the orders was incorrect and needs a doctor signature in order for her to make the appointment. Please advise. ?

## 2021-08-24 NOTE — Patient Instructions (Addendum)
?  A referral was placed today for sleep studies.  ?Please let us know if you have not heard back within 1 week about your referral. ? ?Call [facility] to schedule your diagnostic mammogram and bilateral ultrasound as I have sent the electronic order to their facility.  ?Here is their number : [phone]  ? ?Stop by the lab prior to leaving today. I will notify you of your results once received.  ? ?It was a pleasure seeing you today! Please do not hesitate to reach out with any questions and or concerns. ? ?Regards,  ? ?Camrin Gearheart ?FNP-C ? ? ?

## 2021-08-24 NOTE — Assessment & Plan Note (Signed)
Dx mammogram left breast and us left breast ordered pending results ?

## 2021-08-25 LAB — THYROID PEROXIDASE ANTIBODIES (TPO) (REFL): Thyroperoxidase Ab SerPl-aCnc: 1 IU/mL (ref <9)

## 2021-09-11 ENCOUNTER — Ambulatory Visit: Payer: BC Managed Care – PPO | Admitting: Family

## 2021-09-12 ENCOUNTER — Ambulatory Visit
Admission: RE | Admit: 2021-09-12 | Discharge: 2021-09-12 | Disposition: A | Discharge status: Home / Self Care | Payer: BC Managed Care – PPO | Source: Ambulatory Visit | Attending: Family | Admitting: Family

## 2021-09-12 DIAGNOSIS — N6324 Unspecified lump in the left breast, lower inner quadrant: Secondary | ICD-10-CM | POA: Insufficient documentation

## 2021-09-12 DIAGNOSIS — N6311 Unspecified lump in the right breast, upper outer quadrant: Secondary | ICD-10-CM | POA: Insufficient documentation

## 2021-09-12 DIAGNOSIS — N6321 Unspecified lump in the left breast, upper outer quadrant: Secondary | ICD-10-CM

## 2021-09-12 DIAGNOSIS — N6452 Nipple discharge: Secondary | ICD-10-CM

## 2021-09-12 DIAGNOSIS — N644 Mastodynia: Secondary | ICD-10-CM | POA: Diagnosis not present

## 2021-09-13 ENCOUNTER — Other Ambulatory Visit: Payer: Self-pay | Admitting: Family

## 2021-09-13 ENCOUNTER — Encounter: Payer: Self-pay | Admitting: Family

## 2021-09-13 NOTE — Progress Notes (Signed)
Reviewed noted and agree with recommendations

## 2021-09-13 NOTE — Progress Notes (Signed)
Noted, will continue to follow.  ?Agree with recommendations.

## 2021-09-15 ENCOUNTER — Encounter: Payer: Self-pay | Admitting: Family

## 2021-09-15 ENCOUNTER — Ambulatory Visit: Payer: BC Managed Care – PPO | Admitting: Family

## 2021-09-15 VITALS — BP 116/62 | HR 67 | Temp 97.8°F | Resp 16 | Ht 66.0 in | Wt 257.2 lb

## 2021-09-15 DIAGNOSIS — N6452 Nipple discharge: Secondary | ICD-10-CM | POA: Diagnosis not present

## 2021-09-15 DIAGNOSIS — N6311 Unspecified lump in the right breast, upper outer quadrant: Secondary | ICD-10-CM

## 2021-09-15 DIAGNOSIS — E559 Vitamin D deficiency, unspecified: Secondary | ICD-10-CM

## 2021-09-15 DIAGNOSIS — F411 Generalized anxiety disorder: Secondary | ICD-10-CM

## 2021-09-15 DIAGNOSIS — R0689 Other abnormalities of breathing: Secondary | ICD-10-CM

## 2021-09-15 DIAGNOSIS — E538 Deficiency of other specified B group vitamins: Secondary | ICD-10-CM | POA: Diagnosis not present

## 2021-09-15 MED ORDER — ESCITALOPRAM OXALATE 10 MG PO TABS: 10.0000 mg | ORAL_TABLET | Freq: Every day | ORAL | 1 refills | Status: DC

## 2021-09-15 MED ORDER — CYANOCOBALAMIN 1000 MCG/ML IJ SOLN
1000.0000 ug | Freq: Once | INTRAMUSCULAR | Status: AC
  Administered 2021-09-15: 1000 ug via INTRAMUSCULAR

## 2021-09-15 NOTE — Assessment & Plan Note (Signed)
I instructed pt to start 1/2 tablet lexapro  once daily for 1 week and then increase to a full tablet once daily on week two as tolerated.  We discussed common side effects such as nausea, drowsiness and weight gain.  Also discussed rare but serious side effect of suicidal ideation.  She is instructed to discontinue medication and go directly to ED if this occurs.  Pt verbalizes understanding.  Plan is to follow up in 30 days to evaluate progress.   ? ? ?

## 2021-09-15 NOTE — Assessment & Plan Note (Signed)
Reviewed recent mammogram and u/s , right breast MRI, biopsy and surgical consult recommended.  ?I have reached out to [facility] breast center, they will get back to me on Monday to get these items scheduled.  ?Pt made aware.  ?

## 2021-09-15 NOTE — Assessment & Plan Note (Signed)
Pt to call sleep center to schedule, if she has issues with scheduling will let me know.  ?

## 2021-09-15 NOTE — Progress Notes (Signed)
? ?Established Patient Office Visit ? ?Subjective:  ?Patient ID: Angela Banks, female    DOB: 01/05/1986  Age: 36 y.o. MRN: [id number] ? ?CC:  ?Chief Complaint  ?Patient presents with  ? Breathing Problem  ? ? ?HPI ?Angela PengJennifer L Banks is here today for follow up.  ?Pt is without acute concerns. ? ?Vitamin b12 def: getting b12 injection today, going to start taking 1000 mcg once daily ? ?Vitamin d def: taking RX dosing, will start otc 2000 IU afterwards.  ? ?Abnormal mammogram: tenderness right breast, did give her a trial of augmentin with no relief. She has been recommended to have biopsy breast as well as MRI and surgical consult with ongoing blood nipple discharge. We will assess prolactin today as well.  ? ?Sleep abnormal, wakes up gasping for breath: awaiting sleep study referral.  ? ?Anxiety: worrying a lot and mom' and mother in law with poor health, causes her to worry often and the she had recent abnormal breast testing as well adding more stress.  ? ?Past Medical History:  ?Diagnosis Date  ? Chicken pox   ? Fainting spell   ? Migraine   ? ? ?Past Surgical History:  ?Procedure Laterality Date  ? CESAREAN SECTION  2009  ? tubal  ? CHOLECYSTECTOMY    ? VAGINAL DELIVERY    ? ? ?Family History  ?Problem Relation Age of Onset  ? Diabetes Mother   ? Ovarian cancer Mother   ?     stage 3  ? Arthritis Maternal Grandmother   ? Heart disease Maternal Grandmother   ? Diabetes Maternal Grandmother   ? Kidney cancer Maternal Grandmother   ? Arthritis Maternal Grandfather   ? Heart disease Maternal Grandfather   ? Diabetes Maternal Grandfather   ? Colon cancer Paternal Grandmother   ? Uterine cancer Maternal Aunt   ? ? ?Social History  ? ?Socioeconomic History  ? Marital status: Married  ?  Spouse name: Not on file  ? Number of children: Not on file  ? Years of education: Not on file  ? Highest education level: Not on file  ?Occupational History  ? Not on file  ?Tobacco Use  ? Smoking status: Former  ?  Packs/day:  0.25  ?  Types: Cigarettes  ? Smokeless tobacco: Never  ?Substance and Sexual Activity  ? Alcohol use: No  ?  Alcohol/week: 0.0 standard drinks  ? Drug use: No  ? Sexual activity: Yes  ?  Partners: Male  ?  Birth control/protection: I.U.D.  ?Other Topics Concern  ? Not on file  ?Social History Narrative  ? Lives in [place] with husband and 2 children. 2 dogs in home.  ?   ? 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 Diet - healthy  ? Exercise - home gym  ? ?Social Determinants of Health  ? ?Financial Resource Strain: Not on file  ?Food Insecurity: Not on file  ?Transportation Needs: Not on file  ?Physical Activity: Not on file  ?Stress: Not on file  ?Social Connections: Not on file  ?Intimate Partner Violence: Not on file  ? ? ?Outpatient Medications Prior to Visit  ?Medication Sig Dispense Refill  ? cyanocobalamin 1000 MCG tablet Take 1,000 mcg by mouth daily.    ? Vitamin D, Ergocalciferol, (DRISDOL) 1.25 MG (50000 UNIT) CAPS capsule Take 1 capsule (50,000 Units total) by mouth every 7 (seven) days for 8 doses. 8 capsule 0  ? amoxicillin-clavulanate (AUGMENTIN) 875-125 MG tablet Take 1 tablet  by mouth 2 (two) times daily. (Patient not taking: Reported on 09/15/2021) 20 tablet 0  ? benzonatate (TESSALON) 200 MG capsule Take 1 capsule (200 mg total) by mouth 3 (three) times daily as needed for cough. 20 capsule 0  ? ?No facility-administered medications prior to visit.  ? ? ?No Known Allergies ? ?ROS ?Review of Systems  ?Constitutional:  Positive for fatigue. Negative for chills and fever.  ?Respiratory:  Negative for cough and shortness of breath.   ?Cardiovascular:  Negative for chest pain and leg swelling.  ?Gastrointestinal:  Negative for diarrhea and nausea.  ?Endocrine:  ?     Left breast bloody discharge at times ?Right breast tenderness still at 12/1 o clock position  ?Genitourinary:  Negative for difficulty urinating.  ?Psychiatric/Behavioral:  Positive for sleep disturbance (hard to sleep waking up  gasping for breath, snoring as well at night). Negative for agitation, behavioral problems, dysphoric mood, self-injury and suicidal ideas. The patient is nervous/anxious.   ?All other systems reviewed and are negative. ? ? ?  ?Objective:  ?  ?Physical Exam ?Constitutional:   ?   Appearance: Normal appearance. She is obese.  ?Cardiovascular:  ?   Rate and Rhythm: Normal rate and regular rhythm.  ?Pulmonary:  ?   Effort: Pulmonary effort is normal.  ?Skin: ?   General: Skin is warm.  ?Neurological:  ?   General: No focal deficit present.  ?   Mental Status: She is alert and oriented to person, place, and time. Mental status is at baseline.  ?Psychiatric:     ?   Mood and Affect: Mood normal.     ?   Behavior: Behavior normal.     ?   Thought Content: Thought content normal.     ?   Judgment: Judgment normal.  ? ? ? ? ?BP 116/62   Pulse 67   Temp 97.8 ?F (36.6 ?C)   Resp 16   Ht 5\' 6"  (1.676 m)   Wt 257 lb 3 oz (116.7 kg)   SpO2 98%   BMI 41.51 kg/m?  ?Wt Readings from Last 3 Encounters:  ?09/15/21 257 lb 3 oz (116.7 kg)  ?08/24/21 256 lb 1 oz (116.1 kg)  ?10/08/16 223 lb (101.2 kg)  ? ? ? ?Health Maintenance Due  ?Topic Date Due  ? HIV Screening  Never done  ? Hepatitis C Screening  Never done  ? PAP SMEAR-Modifier  11/26/2010  ? TETANUS/TDAP  11/26/2019  ? COVID-19 Vaccine (2 - Pfizer series) 03/04/2020  ? ? ?There are no preventive care reminders to display for this patient. ? ?Lab Results  ?Component Value Date  ? TSH 1.74 08/24/2021  ? ?Lab Results  ?Component Value Date  ? WBC 6.5 08/24/2021  ? HGB 14.1 08/24/2021  ? HCT 42.9 08/24/2021  ? MCV 90.4 08/24/2021  ? PLT 245.0 08/24/2021  ? ?Lab Results  ?Component Value Date  ? NA 140 08/24/2021  ? K 4.5 08/24/2021  ? CO2 29 08/24/2021  ? GLUCOSE 86 08/24/2021  ? BUN 13 08/24/2021  ? CREATININE 0.76 08/24/2021  ? BILITOT 0.6 08/24/2021  ? ALKPHOS 77 08/24/2021  ? AST 22 08/24/2021  ? ALT 31 08/24/2021  ? PROT 6.9 08/24/2021  ? ALBUMIN 4.5 08/24/2021  ?  CALCIUM 9.4 08/24/2021  ? ANIONGAP 8 02/08/2012  ? GFR 101.10 08/24/2021  ? ?Lab Results  ?Component Value Date  ? CHOL 192 08/24/2021  ? ?Lab Results  ?Component Value Date  ? HDL 61.50 08/24/2021  ? ?  Lab Results  ?Component Value Date  ? LDLCALC 115 (H) 08/24/2021  ? ?Lab Results  ?Component Value Date  ? TRIG 79.0 08/24/2021  ? ?Lab Results  ?Component Value Date  ? CHOLHDL 3 08/24/2021  ? ?Lab Results  ?Component Value Date  ? HGBA1C 5.9 08/24/2021  ? ? ?  ?Assessment & Plan:  ? ?Problem List Items Addressed This Visit   ? ?  ? Other  ? Mass of upper outer quadrant of right breast  ?  Reviewed recent mammogram and u/s , right breast MRI, biopsy and surgical consult recommended.  ?I have reached out to [facility] breast center, they will get back to me on Monday to get these items scheduled.  ?Pt made aware.  ? ?  ?  ? Vitamin D deficiency  ?  Continue rx 50000 vitaminD3 ?Once RX complete start vitamin D3 2000 IU once daily ? ?  ?  ? Gasping for breath  ?  Pt to call sleep center to schedule, if she has issues with scheduling will let me know.  ? ?  ?  ? Vitamin B12 deficiency  ?  b12 administered in office ?Pt tolerated procedure well  ?Verbal consent obtained prior to administration ? ?Start otc 1000 mcg once daily  ? ?  ?  ? Nipple discharge, bloody - Primary  ?  augmentin completed, no improvement.  ?Ordering prolactin today, pending results.  ? ?  ?  ? Relevant Orders  ? Prolactin  ? GAD (generalized anxiety disorder)  ?   I instructed pt to start 1/2 tablet lexapro  once daily for 1 week and then increase to a full tablet once daily on week two as tolerated.  We discussed common side effects such as nausea, drowsiness and weight gain.  Also discussed rare but serious side effect of suicidal ideation.  She is instructed to discontinue medication and go directly to ED if this occurs.  Pt verbalizes understanding.  Plan is to follow up in 30 days to evaluate progress.   ? ? ?  ?  ? Relevant Medications  ?  escitalopram (LEXAPRO) 10 MG tablet  ? ? ?Meds ordered this encounter  ?Medications  ? escitalopram (LEXAPRO) 10 MG tablet  ?  Sig: Take 1 tablet (10 mg total) by mouth daily.  ?  Dispense:  30 tablet  ?  Refill:  1  ?  Order

## 2021-09-15 NOTE — Assessment & Plan Note (Signed)
Continue rx 50000 vitaminD3 ?Once RX complete start vitamin D3 2000 IU once daily ?

## 2021-09-15 NOTE — Patient Instructions (Addendum)
Please call [phone] to schedule your sleep study.  ?This is the [facility] sleep study center. The referral was sent electronically.  ? ?Start lexapro 10 mg for anxiety and depression. Take 1/2 tablet by mouth once daily for about one week, then increase to 1 full tablet thereafter.  ? ?Taking the medicine as directed and not missing any doses is one of the best things you can do to treat your anxiety. Here are some things to keep in mind: ? ?Side effects (stomach upset, some increased anxiety) may happen before you notice a benefit.  These side effects typically go away over time. ?Changes to your dose of medicine or a change in medication all together is sometimes necessary ?Many people will notice an improvement within two weeks but the full effect of the medication can take up to 4-6 weeks ?Stopping the medication when you start feeling better often results in a return of symptoms. Most people need to be on medication at least 6-12 months ?If you start having thoughts of hurting yourself or others after starting this medicine, please call me immediately.   ? ?Stop by the lab prior to leaving today. I will notify you of your results once received.  ? ?Due to recent changes in healthcare laws, you may see results of your imaging and/or laboratory studies on MyChart before I have had a chance to review them.  I understand that in some cases there may be results that are confusing or concerning to you. Please understand that not all results are received at the same time and often I may need to interpret multiple results in order to provide you with the best plan of care or course of treatment. Therefore, I ask that you please give me 2 business days to thoroughly review all your results before contacting my office for clarification. Should we see a critical lab result, you will be contacted sooner.  ? ?It was a pleasure seeing you today! Please do not hesitate to reach out with any questions and or  concerns. ? ?Regards,  ? ?Branda Chaudhary ?FNP-C ? ?

## 2021-09-15 NOTE — Assessment & Plan Note (Signed)
b12 administered in office ?Pt tolerated procedure well  ?Verbal consent obtained prior to administration ? ?Start otc 1000 mcg once daily  ? ?

## 2021-09-15 NOTE — Assessment & Plan Note (Signed)
augmentin completed, no improvement.  ?Ordering prolactin today, pending results.  ?

## 2021-09-16 LAB — PROLACTIN: Prolactin: 8.3 ng/mL

## 2021-09-18 ENCOUNTER — Ambulatory Visit: Payer: BC Managed Care – PPO | Admitting: Family

## 2021-09-20 ENCOUNTER — Other Ambulatory Visit: Payer: Self-pay | Admitting: Family

## 2021-09-20 DIAGNOSIS — N6310 Unspecified lump in the right breast, unspecified quadrant: Secondary | ICD-10-CM

## 2021-09-20 DIAGNOSIS — N6452 Nipple discharge: Secondary | ICD-10-CM

## 2021-09-29 ENCOUNTER — Telehealth: Payer: Self-pay

## 2021-09-29 NOTE — Telephone Encounter (Signed)
Pt is scheduled for and MRI Bilateral Breast 10-04-21 at [facility]. No precert has been done yet. [phone]. She is out of the office Monday, but said someone can call and speak to someone else. ?

## 2021-10-02 NOTE — Telephone Encounter (Signed)
Approved via [employer] ?Auth# [id number]    Exp 10/31/2021 ? ?Left message with Efraim Kaufmann at the [facility] making aware ?

## 2021-10-04 ENCOUNTER — Ambulatory Visit
Admission: RE | Admit: 2021-10-04 | Discharge: 2021-10-04 | Disposition: A | Discharge status: Home / Self Care | Payer: BC Managed Care – PPO | Source: Ambulatory Visit | Attending: Family | Admitting: Family

## 2021-10-04 DIAGNOSIS — N6310 Unspecified lump in the right breast, unspecified quadrant: Secondary | ICD-10-CM | POA: Insufficient documentation

## 2021-10-04 DIAGNOSIS — N6452 Nipple discharge: Secondary | ICD-10-CM | POA: Diagnosis not present

## 2021-10-04 DIAGNOSIS — R928 Other abnormal and inconclusive findings on diagnostic imaging of breast: Secondary | ICD-10-CM | POA: Diagnosis not present

## 2021-10-04 MED ORDER — GADOBUTROL 1 MMOL/ML IV SOLN
10.0000 mL | Freq: Once | INTRAVENOUS | Status: AC | PRN
  Administered 2021-10-04: 10 mL via INTRAVENOUS

## 2021-10-06 ENCOUNTER — Other Ambulatory Visit: Payer: Self-pay | Admitting: Family

## 2021-10-06 DIAGNOSIS — R928 Other abnormal and inconclusive findings on diagnostic imaging of breast: Secondary | ICD-10-CM

## 2021-10-06 DIAGNOSIS — N63 Unspecified lump in unspecified breast: Secondary | ICD-10-CM

## 2021-10-17 ENCOUNTER — Ambulatory Visit (INDEPENDENT_AMBULATORY_CARE_PROVIDER_SITE_OTHER): Payer: BC Managed Care – PPO | Admitting: *Deleted

## 2021-10-17 DIAGNOSIS — E538 Deficiency of other specified B group vitamins: Secondary | ICD-10-CM | POA: Diagnosis not present

## 2021-10-17 MED ORDER — CYANOCOBALAMIN 1000 MCG/ML IJ SOLN
1000.0000 ug | Freq: Once | INTRAMUSCULAR | Status: AC
  Administered 2021-10-17: 1000 ug via INTRAMUSCULAR

## 2021-10-17 NOTE — Progress Notes (Signed)
Per orders of Tabitha Dugal FNP, injection of vitamin B 12 given by Garnetta Fedrick. Patient tolerated injection well.  

## 2021-10-19 ENCOUNTER — Ambulatory Visit
Admission: RE | Admit: 2021-10-19 | Discharge: 2021-10-19 | Disposition: A | Discharge status: Home / Self Care | Payer: BC Managed Care – PPO | Source: Ambulatory Visit | Attending: Family | Admitting: Family

## 2021-10-19 DIAGNOSIS — N6310 Unspecified lump in the right breast, unspecified quadrant: Secondary | ICD-10-CM | POA: Diagnosis not present

## 2021-10-19 DIAGNOSIS — N6341 Unspecified lump in right breast, subareolar: Secondary | ICD-10-CM | POA: Diagnosis not present

## 2021-10-19 DIAGNOSIS — N63 Unspecified lump in unspecified breast: Secondary | ICD-10-CM | POA: Insufficient documentation

## 2021-10-19 DIAGNOSIS — R928 Other abnormal and inconclusive findings on diagnostic imaging of breast: Secondary | ICD-10-CM

## 2021-10-19 HISTORY — PX: BREAST BIOPSY: SHX20

## 2021-10-20 ENCOUNTER — Other Ambulatory Visit: Payer: Self-pay

## 2021-10-20 DIAGNOSIS — F411 Generalized anxiety disorder: Secondary | ICD-10-CM

## 2021-10-20 LAB — SURGICAL PATHOLOGY

## 2021-10-24 MED ORDER — ESCITALOPRAM OXALATE 10 MG PO TABS: 10.0000 mg | ORAL_TABLET | Freq: Every day | ORAL | 0 refills | Status: DC

## 2021-10-25 NOTE — Progress Notes (Signed)
noted 

## 2021-10-30 ENCOUNTER — Ambulatory Visit: Payer: BC Managed Care – PPO | Admitting: Family

## 2021-10-30 ENCOUNTER — Encounter: Payer: Self-pay | Admitting: Family

## 2021-10-30 VITALS — BP 110/64 | HR 96 | Temp 98.6°F | Ht 66.0 in | Wt 258.0 lb

## 2021-10-30 DIAGNOSIS — F411 Generalized anxiety disorder: Secondary | ICD-10-CM | POA: Diagnosis not present

## 2021-10-30 DIAGNOSIS — E538 Deficiency of other specified B group vitamins: Secondary | ICD-10-CM | POA: Diagnosis not present

## 2021-10-30 DIAGNOSIS — R232 Flushing: Secondary | ICD-10-CM | POA: Insufficient documentation

## 2021-10-30 DIAGNOSIS — E282 Polycystic ovarian syndrome: Secondary | ICD-10-CM

## 2021-10-30 LAB — FOLLICLE STIMULATING HORMONE: FSH: 6.1 m[IU]/mL

## 2021-10-30 LAB — LUTEINIZING HORMONE: LH: 11.34 m[IU]/mL

## 2021-10-30 LAB — TSH: TSH: 2.09 u[IU]/mL (ref 0.35–5.50)

## 2021-10-30 MED ORDER — CYANOCOBALAMIN 1000 MCG/ML IJ SOLN
1000.0000 ug | Freq: Once | INTRAMUSCULAR | Status: AC
  Administered 2021-10-30: 1000 ug via INTRAMUSCULAR

## 2021-10-30 MED ORDER — ESCITALOPRAM OXALATE 10 MG PO TABS: 10.0000 mg | ORAL_TABLET | Freq: Every day | ORAL | 1 refills | Status: DC

## 2021-10-30 NOTE — Progress Notes (Signed)
Established Patient Office Visit  Subjective:  Patient ID: Angela Banks, female    DOB: 04/04/1986  Age: 36 y.o. MRN: [id number]  CC:  Chief Complaint  Patient presents with  . Follow-up    From biopsy and d/c from nipple     HPI Angela Banks is here today for follow up.   Vitamin b12 def: last injection today for b12, taking 1000 mcg otc as well.   Hot flashes: feels like more often than not. Sweating sensation comes out of nowhere. Has iud mirena in place, placed six years ago. Has pcos. Periods were really heavy in the past prior to iud.   Vitamin d def: Last vitamin D Lab Results  Component Value Date   VD25OH 26.70 (L) 08/24/2021   Blood discharge bil breasts: still with ongoing bil breast discharge. More like dried blood. Recent biopsy benign. She was told if she wanted to d/c the discharge likely would need a ductal excision from a surgeon.   GAD: lexapro 10 mg once daily. Is noticing a big difference in her anxiety. Started back end of April.     10/30/2021    8:37 AM 08/24/2021    9:44 AM 08/24/2021    9:22 AM  GAD 7 : Generalized Anxiety Score  Nervous, Anxious, on Edge 1 1 1   Control/stop worrying 1 2 2   Worry too much - different things 1 2 2   Trouble relaxing 0 2 2  Restless 1 0 0  Easily annoyed or irritable 2 0 0  Afraid - awful might happen 0 0 0  Total GAD 7 Score 6 7 7   Anxiety Difficulty Not difficult at all Somewhat difficult Somewhat difficult         Past Medical History:  Diagnosis Date  . Chicken pox   . Fainting spell   . Gasping for breath 08/24/2021  . Mass of upper outer quadrant of right breast 08/24/2021  . Migraine     Past Surgical History:  Procedure Laterality Date  . BREAST BIOPSY Right 10/19/2021   us bx, path pending  . CESAREAN SECTION  05/22/2007   tubal  . CHOLECYSTECTOMY    . VAGINAL DELIVERY      Family History  Problem Relation Age of Onset  . Diabetes Mother   . Ovarian cancer Mother        stage  3  . Arthritis Maternal Grandmother   . Heart disease Maternal Grandmother   . Diabetes Maternal Grandmother   . Kidney cancer Maternal Grandmother   . Arthritis Maternal Grandfather   . Heart disease Maternal Grandfather   . Diabetes Maternal Grandfather   . Colon cancer Paternal Grandmother   . Uterine cancer Maternal Aunt     Social History   Socioeconomic History  . Marital status: Married    Spouse name: Not on file  . Number of children: Not on file  . Years of education: Not on file  . Highest education level: Not on file  Occupational History  . Not on file  Tobacco Use  . Smoking status: Former    Packs/day: 0.25    Types: Cigarettes  . Smokeless tobacco: Never  Substance and Sexual Activity  . Alcohol use: No    Alcohol/week: 0.0 standard drinks of alcohol  . Drug use: No  . Sexual activity: Yes    Partners: Male    Birth control/protection: I.U.D.  Other Topics Concern  . Not on file  Social History Narrative  Lives in [place] with husband and 2 children. 2 dogs in home.      Work - Clinical cytogeneticist in [employer]   Diet - healthy   Exercise - home gym   Social Determinants of Health   Financial Resource Strain: Not on file  Food Insecurity: Not on file  Transportation Needs: Not on file  Physical Activity: Not on file  Stress: Not on file  Social Connections: Not on file  Intimate Partner Violence: Not on file    Outpatient Medications Prior to Visit  Medication Sig Dispense Refill  . Cholecalciferol (VITAMIN D) 50 MCG (2000 UT) CAPS Take by mouth.    . cyanocobalamin 1000 MCG tablet Take 1,000 mcg by mouth daily.    Marland Kitchen escitalopram (LEXAPRO) 10 MG tablet Take 1 tablet (10 mg total) by mouth daily. 90 tablet 0   No facility-administered medications prior to visit.    No Known Allergies      Objective:    Physical Exam Constitutional:      General: She is not in acute distress.    Appearance: Normal appearance. She is obese. She  is not ill-appearing, toxic-appearing or diaphoretic.  Cardiovascular:     Rate and Rhythm: Normal rate and regular rhythm.  Pulmonary:     Effort: Pulmonary effort is normal.     Breath sounds: Normal breath sounds.  Neurological:     General: No focal deficit present.     Mental Status: She is alert and oriented to person, place, and time.  Psychiatric:        Attention and Perception: Attention normal.        Mood and Affect: Mood is anxious. Affect is not flat, tearful or inappropriate.        Behavior: Behavior normal.        Thought Content: Thought content normal. Thought content does not include homicidal or suicidal ideation. Thought content does not include suicidal plan.        Judgment: Judgment normal.     BP 110/64   Pulse 96   Temp 98.6 F (37 C) (Oral)   Ht 5\' 6"  (1.676 m)   Wt 258 lb (117 kg)   SpO2 96%   BMI 41.64 kg/m  Wt Readings from Last 3 Encounters:  10/30/21 258 lb (117 kg)  09/15/21 257 lb 3 oz (116.7 kg)  08/24/21 256 lb 1 oz (116.1 kg)     Health Maintenance Due  Topic Date Due  . HIV Screening  Never done  . Hepatitis C Screening  Never done  . TETANUS/TDAP  11/26/2019    There are no preventive care reminders to display for this patient.  Lab Results  Component Value Date   TSH 1.74 08/24/2021   Lab Results  Component Value Date   WBC 6.5 08/24/2021   HGB 14.1 08/24/2021   HCT 42.9 08/24/2021   MCV 90.4 08/24/2021   PLT 245.0 08/24/2021   Lab Results  Component Value Date   NA 140 08/24/2021   K 4.5 08/24/2021   CO2 29 08/24/2021   GLUCOSE 86 08/24/2021   BUN 13 08/24/2021   CREATININE 0.76 08/24/2021   BILITOT 0.6 08/24/2021   ALKPHOS 77 08/24/2021   AST 22 08/24/2021   ALT 31 08/24/2021   PROT 6.9 08/24/2021   ALBUMIN 4.5 08/24/2021   CALCIUM 9.4 08/24/2021   ANIONGAP 8 02/08/2012   GFR 101.10 08/24/2021   Lab Results  Component Value Date   CHOL 192 08/24/2021  Lab Results  Component Value Date   HDL  61.50 08/24/2021   Lab Results  Component Value Date   LDLCALC 115 (H) 08/24/2021   Lab Results  Component Value Date   TRIG 79.0 08/24/2021   Lab Results  Component Value Date   CHOLHDL 3 08/24/2021   Lab Results  Component Value Date   HGBA1C 5.9 08/24/2021      Assessment & Plan:   Problem List Items Addressed This Visit       Cardiovascular and Mediastinum   Hot flashes - Primary    Hormonal workup pending results May need to consider f/u with gyn to consider iud removal as this with bloody nipple discharge, maybe hormonal? Reviewed most recent imaging for breasts      Relevant Orders   Follicle stimulating hormone   TSH   Testos,Total,Free and SHBG (Female)   Luteinizing hormone   Estradiol     Endocrine   PCOS (polycystic ovarian syndrome)   Relevant Orders   Follicle stimulating hormone   TSH   Testos,Total,Free and SHBG (Female)   Luteinizing hormone   Estradiol     Other   Vitamin B12 deficiency    Administered b12 1000 mcg IM in office Pt tolerated procedure well  Verbal consent obtained prior to administration          GAD (generalized anxiety disorder)    Reviewed gad 7 and phq9 Refill lexapro 10 mg once  Work on anxiety reducing techniques      Relevant Medications   escitalopram (LEXAPRO) 10 MG tablet    Meds ordered this encounter  Medications  . escitalopram (LEXAPRO) 10 MG tablet    Sig: Take 1 tablet (10 mg total) by mouth daily.    Dispense:  90 tablet    Refill:  1    Order Specific Question:   Supervising Provider    Answer:   BEDSOLE, AMY E [[id number]]  . cyanocobalamin ((VITAMIN B-12)) injection 1,000 mcg    Follow-up: Return in about 6 months (around 05/01/2022) for regular follow up .    Mort Sawyers, FNP

## 2021-10-30 NOTE — Assessment & Plan Note (Signed)
Hormonal workup pending results May need to consider f/u with gyn to consider iud removal as this with bloody nipple discharge, maybe hormonal? Reviewed most recent imaging for breasts

## 2021-10-30 NOTE — Assessment & Plan Note (Addendum)
Reviewed gad 7 and phq9 Refill lexapro 10 mg once  Work on anxiety reducing techniques

## 2021-10-30 NOTE — Patient Instructions (Signed)
Reach out to GYN in regards to mirena. Let her know about the blood nipple discharge and hot flashes, see if maybe mirena needs to come out.   Due to recent changes in healthcare laws, you may see results of your imaging and/or laboratory studies on MyChart before I have had a chance to review them.  I understand that in some cases there may be results that are confusing or concerning to you. Please understand that not all results are received at the same time and often I may need to interpret multiple results in order to provide you with the best plan of care or course of treatment. Therefore, I ask that you please give me 2 business days to thoroughly review all your results before contacting my office for clarification. Should we see a critical lab result, you will be contacted sooner.   It was a pleasure seeing you today! Please do not hesitate to reach out with any questions and or concerns.  Regards,   Mort Sawyers FNP-C

## 2021-10-30 NOTE — Assessment & Plan Note (Signed)
Administered b12 1000 mcg IM in office Pt tolerated procedure well  Verbal consent obtained prior to administration

## 2021-11-03 LAB — TESTOS,TOTAL,FREE AND SHBG (FEMALE)
Free Testosterone: 8 pg/mL — ABNORMAL HIGH (ref 0.1–6.4)
Sex Hormone Binding: 18 nmol/L (ref 17–124)
Testosterone, Total, LC-MS-MS: 45 ng/dL (ref 2–45)

## 2021-11-03 LAB — ESTRADIOL: Estradiol: 61 pg/mL

## 2021-11-07 ENCOUNTER — Encounter: Payer: Self-pay | Admitting: Family

## 2021-11-08 ENCOUNTER — Other Ambulatory Visit: Payer: Self-pay | Admitting: Family

## 2021-11-08 DIAGNOSIS — N6452 Nipple discharge: Secondary | ICD-10-CM

## 2021-11-08 DIAGNOSIS — R7989 Other specified abnormal findings of blood chemistry: Secondary | ICD-10-CM

## 2021-11-08 DIAGNOSIS — E282 Polycystic ovarian syndrome: Secondary | ICD-10-CM

## 2021-11-08 NOTE — Progress Notes (Signed)
Slight elevated testosterone. Otherwise labs normal.  Will refer pt to endo given also bloody discharge from nipples. Referral placed.

## 2021-12-01 NOTE — Telephone Encounter (Unsigned)
Yes Angela Banks I guess this is where she could try since they recommended reproductive endocrine.

## 2021-12-01 NOTE — Telephone Encounter (Signed)
Option for local Reproductive Endocrinologist  [facility]  Reproductive Medicine - [facility]  Address: [address] [place], [place] [address]     Phone: [phone]   Msg to Wyatt Mage on if this is where the patient needs to be sent.

## 2021-12-07 ENCOUNTER — Telehealth: Payer: Self-pay

## 2021-12-07 NOTE — Telephone Encounter (Signed)
Lm for patient to ask if she has had previous sleep study.  

## 2021-12-08 ENCOUNTER — Institutional Professional Consult (permissible substitution): Payer: BC Managed Care – PPO | Admitting: Adult Health

## 2021-12-08 NOTE — Telephone Encounter (Signed)
Spoke to patient. She stated that she has not had previous sleep study.  Nothing further needed.

## 2022-02-09 ENCOUNTER — Encounter: Payer: Self-pay | Admitting: Family

## 2022-02-09 ENCOUNTER — Other Ambulatory Visit (INDEPENDENT_AMBULATORY_CARE_PROVIDER_SITE_OTHER): Payer: BC Managed Care – PPO

## 2022-02-09 ENCOUNTER — Telehealth (INDEPENDENT_AMBULATORY_CARE_PROVIDER_SITE_OTHER): Payer: BC Managed Care – PPO | Admitting: Family

## 2022-02-09 VITALS — Ht 66.0 in | Wt 242.0 lb

## 2022-02-09 DIAGNOSIS — E282 Polycystic ovarian syndrome: Secondary | ICD-10-CM | POA: Diagnosis not present

## 2022-02-09 DIAGNOSIS — R600 Localized edema: Secondary | ICD-10-CM | POA: Diagnosis not present

## 2022-02-09 DIAGNOSIS — N926 Irregular menstruation, unspecified: Secondary | ICD-10-CM

## 2022-02-09 DIAGNOSIS — R635 Abnormal weight gain: Secondary | ICD-10-CM | POA: Insufficient documentation

## 2022-02-09 DIAGNOSIS — N6452 Nipple discharge: Secondary | ICD-10-CM | POA: Diagnosis not present

## 2022-02-09 DIAGNOSIS — R7989 Other specified abnormal findings of blood chemistry: Secondary | ICD-10-CM

## 2022-02-09 DIAGNOSIS — F411 Generalized anxiety disorder: Secondary | ICD-10-CM

## 2022-02-09 LAB — COMPREHENSIVE METABOLIC PANEL
ALT: 27 U/L (ref 0–35)
AST: 16 U/L (ref 0–37)
Albumin: 4.2 g/dL (ref 3.5–5.2)
Alkaline Phosphatase: 67 U/L (ref 39–117)
BUN: 14 mg/dL (ref 6–23)
CO2: 26 mEq/L (ref 19–32)
Calcium: 9.7 mg/dL (ref 8.4–10.5)
Chloride: 104 mEq/L (ref 96–112)
Creatinine, Ser: 0.72 mg/dL (ref 0.40–1.20)
GFR: 107.53 mL/min (ref >60.00)
Glucose, Bld: 86 mg/dL (ref 70–99)
Potassium: 4 mEq/L (ref 3.5–5.1)
Sodium: 138 mEq/L (ref 135–145)
Total Bilirubin: 0.4 mg/dL (ref 0.2–1.2)
Total Protein: 6.8 g/dL (ref 6.0–8.3)

## 2022-02-09 LAB — TSH: TSH: 2.49 u[IU]/mL (ref 0.35–5.50)

## 2022-02-09 MED ORDER — HYDROCHLOROTHIAZIDE 25 MG PO TABS: 25.0000 mg | ORAL_TABLET | Freq: Every day | ORAL | 0 refills | Status: DC

## 2022-02-09 NOTE — Patient Instructions (Addendum)
Trial RX HCTZ to see if this helps with the fluid. We will also order lab work.   A referral was placed today for endo (reproductive endo) Please let us know if you have not heard back within 2 weeks about the referral.  [facility]  Address: [address] [place], [place] [address]     Phone: [phone]   Regards,   Eugenia Pancoast FNP-C

## 2022-02-09 NOTE — Assessment & Plan Note (Signed)
Continue lexapro 10 mg once daily  

## 2022-02-09 NOTE — Assessment & Plan Note (Signed)
still ongoing.  Referral placed again, for reproductive endocrinologyt. Pending consult.

## 2022-02-09 NOTE — Assessment & Plan Note (Signed)
Low suspicion for DVT Ordering cmp tsh  Trial hctz 25 mg once daily Continue elevation of legs when able, compression sleeves, and low sodium diet.  F/u two weeks we will re eval

## 2022-02-09 NOTE — Assessment & Plan Note (Signed)
tsh and cmp ordered pending results

## 2022-02-09 NOTE — Progress Notes (Signed)
MyChart Video Visit    Virtual Visit via Video Note   This visit type was conducted due to national recommendations for restrictions regarding the COVID-19 Pandemic (e.g. social distancing) in an effort to limit this patient's exposure and mitigate transmission in our community. This patient is at least at moderate risk for complications without adequate follow up. This format is felt to be most appropriate for this patient at this time. Physical exam was limited by quality of the video and audio technology used for the visit. CMA was able to get the patient set up on a video visit.  Patient location: Home. Patient and provider in visit Provider location: Office  I discussed the limitations of evaluation and management by telemedicine and the availability of in person appointments. The patient expressed understanding and agreed to proceed.  Visit Date: 02/09/2022  Today's healthcare provider: Mort Sawyers, FNP     Subjective:    Patient ID: Angela Banks, female    DOB: [birth year], 36 y.o.   MRN: [id number]  Chief Complaint  Patient presents with   Leg Swelling    Off and on for while skin feels tight   Referral    Never received call from endo. Looked at last referral looks like they sent message back that they can not see. Needs to be sent to reproductive endo.     HPI  Pt here today via video visit with concerns.   Has had issues with bil legs swelling and notices that her legs are starting to hold a good amount of fluid. She states if she touches where the fluid is she will notice 'dents' in the lower legs. She is really concerned about her weight, was 268, and has lost weight and down to 242 eating super clean but now plateauing and the fluid in her legs is starting to feel tight from the fluid and thinks not helping with her weight. She lost four pant sizes in that time period as well but in the last three weeks no recent change in weight. No chest pain,  palpitations or sob. Denies any back pain.   Taking compression socks because she works 12 hours sifts and sits primarily however she has been trying to walk at least every hour or so, and will take socks off and will wake up with only slight reduction in fluid in legs.   She did try an otc diuretic without much relief for the last two weeks. Denies redness or heat in site, only painful throbbing when the fluid is really full.   Denies any urinary symptoms. Urinating as normal. Is drinking a good amount of water.   Anxiety/depression: doing very well on lexapro 10 mg once doing, and states she likes this dose and feels much better while taking lexapro.   Past Medical History:  Diagnosis Date   Chicken pox    Fainting spell    Gasping for breath 08/24/2021   Mass of upper outer quadrant of right breast 08/24/2021   Migraine     Past Surgical History:  Procedure Laterality Date   BREAST BIOPSY Right 10/19/2021   [place] bx, path pending   CESAREAN SECTION  05/22/2007   tubal   CHOLECYSTECTOMY     VAGINAL DELIVERY      Family History  Problem Relation Age of Onset   Diabetes Mother    Ovarian cancer Mother        stage 3   Arthritis Maternal Grandmother    Heart  disease Maternal Grandmother    Diabetes Maternal Grandmother    Kidney cancer Maternal Grandmother    Arthritis Maternal Grandfather    Heart disease Maternal Grandfather    Diabetes Maternal Grandfather    Colon cancer Paternal Grandmother    Uterine cancer Maternal Aunt     Social History   Socioeconomic History   Marital status: Married    Spouse name: Not on file   Number of children: Not on file   Years of education: Not on file   Highest education level: Not on file  Occupational History   Not on file  Tobacco Use   Smoking status: Former    Packs/day: 0.25    Types: Cigarettes   Smokeless tobacco: Never  Substance and Sexual Activity   Alcohol use: No    Alcohol/week: 0.0 standard drinks of alcohol    Drug use: No   Sexual activity: Yes    Partners: Male    Birth control/protection: I.U.D.  Other Topics Concern   Not on file  Social History Narrative   Lives in [place] with husband and 2 children. 2 dogs in home.      Work - Clinical cytogeneticist in [employer]   Diet - healthy   Exercise - home gym   Social Determinants of Health   Financial Resource Strain: Not on file  Food Insecurity: Not on file  Transportation Needs: Not on file  Physical Activity: Not on file  Stress: Not on file  Social Connections: Not on file  Intimate Partner Violence: Not on file    Outpatient Medications Prior to Visit  Medication Sig Dispense Refill   cholecalciferol (VITAMIN D3) 25 MCG (1000 UNIT) tablet Take 1,000 Units by mouth daily.     Cyanocobalamin (VITAMIN B 12 PO) Take by mouth.     escitalopram (LEXAPRO) 10 MG tablet Take 1 tablet (10 mg total) by mouth daily. 90 tablet 1   Cholecalciferol (VITAMIN D) 50 MCG (2000 UT) CAPS Take by mouth.     cyanocobalamin 1000 MCG tablet Take 1,000 mcg by mouth daily.     No facility-administered medications prior to visit.    No Known Allergies       Objective:    Physical Exam Constitutional:      Appearance: She is obese.  Pulmonary:     Effort: Pulmonary effort is normal.  Neurological:     General: No focal deficit present.     Mental Status: She is alert and oriented to person, place, and time. Mental status is at baseline.  Psychiatric:        Mood and Affect: Mood normal.        Behavior: Behavior normal.        Thought Content: Thought content normal.        Judgment: Judgment normal.     Ht 5\' 6"  (1.676 m)   Wt 242 lb (109.8 kg)   BMI 39.06 kg/m  Wt Readings from Last 3 Encounters:  02/09/22 242 lb (109.8 kg)  10/30/21 258 lb (117 kg)  09/15/21 257 lb 3 oz (116.7 kg)       Assessment & Plan:   Problem List Items Addressed This Visit       Endocrine   PCOS (polycystic ovarian syndrome)   Relevant Orders    Ambulatory referral to Endocrinology     Other   Irregular menses   Relevant Orders   Ambulatory referral to Endocrinology   Nipple discharge, bloody  still ongoing.  Referral placed again, for reproductive endocrinologyt. Pending consult.       Relevant Orders   Ambulatory referral to Endocrinology   GAD (generalized anxiety disorder)    Continue lexapro 10 mg once daily.       Abnormal weight gain    tsh and cmp ordered pending results        Relevant Orders   TSH   Elevated testosterone level   Relevant Orders   Ambulatory referral to Endocrinology   Pedal edema - Primary    Low suspicion for DVT Ordering cmp tsh  Trial hctz 25 mg once daily Continue elevation of legs when able, compression sleeves, and low sodium diet.  F/u two weeks we will re eval      Relevant Medications   hydrochlorothiazide (HYDRODIURIL) 25 MG tablet   Other Relevant Orders   Comprehensive metabolic panel   RESOLVED: Nipple discharge in female   Relevant Orders   Ambulatory referral to Endocrinology    I have discontinued Angela Banks's cyanocobalamin and Vitamin D. I am also having her start on hydrochlorothiazide. Additionally, I am having her maintain her escitalopram, cholecalciferol, and Cyanocobalamin (VITAMIN B 12 PO).  Meds ordered this encounter  Medications   hydrochlorothiazide (HYDRODIURIL) 25 MG tablet    Sig: Take 1 tablet (25 mg total) by mouth daily.    Dispense:  30 tablet    Refill:  0    Order Specific Question:   Supervising Provider    Answer:   BEDSOLE, AMY E [[id number]]    I discussed the assessment and treatment plan with the patient. The patient was provided an opportunity to ask questions and all were answered. The patient agreed with the plan and demonstrated an understanding of the instructions.   The patient was advised to call back or seek an in-person evaluation if the symptoms worsen or if the condition fails to improve as anticipated.  I  provided 15 minutes of face-to-face time during this encounter.   Angela Banks, [facility] [place] [phone] (phone) [phone] (fax)  [place]

## 2022-02-22 ENCOUNTER — Encounter: Payer: Self-pay | Admitting: Family

## 2022-02-22 DIAGNOSIS — F430 Acute stress reaction: Secondary | ICD-10-CM

## 2022-02-22 DIAGNOSIS — F411 Generalized anxiety disorder: Secondary | ICD-10-CM

## 2022-02-22 NOTE — Telephone Encounter (Signed)
Hey Ashtyn,   This poor girl. Idk if you remember [place] chatting about her referral prior, but she was referred initially to endo who recommended a reproductive endo. Well she saw them and they said they couldn't help her and she would need to see a regular endo. Do you have another recommendation, maybe another office than the first endo recommended way back when?

## 2022-02-26 ENCOUNTER — Encounter: Payer: Self-pay | Admitting: Family

## 2022-02-26 ENCOUNTER — Ambulatory Visit: Payer: BC Managed Care – PPO | Admitting: Family

## 2022-02-26 VITALS — BP 110/74 | HR 74 | Temp 98.6°F | Resp 16 | Ht 66.0 in | Wt 243.2 lb

## 2022-02-26 DIAGNOSIS — N926 Irregular menstruation, unspecified: Secondary | ICD-10-CM

## 2022-02-26 DIAGNOSIS — R600 Localized edema: Secondary | ICD-10-CM

## 2022-02-26 DIAGNOSIS — R635 Abnormal weight gain: Secondary | ICD-10-CM

## 2022-02-26 DIAGNOSIS — N6452 Nipple discharge: Secondary | ICD-10-CM | POA: Diagnosis not present

## 2022-02-26 DIAGNOSIS — F411 Generalized anxiety disorder: Secondary | ICD-10-CM | POA: Diagnosis not present

## 2022-02-26 DIAGNOSIS — E282 Polycystic ovarian syndrome: Secondary | ICD-10-CM | POA: Diagnosis not present

## 2022-02-26 DIAGNOSIS — R7989 Other specified abnormal findings of blood chemistry: Secondary | ICD-10-CM

## 2022-02-26 LAB — POCT GLYCOSYLATED HEMOGLOBIN (HGB A1C): Hemoglobin A1C: 5.3 % (ref 4.0–5.6)

## 2022-02-26 MED ORDER — METFORMIN HCL 500 MG PO TABS: 500.0000 mg | ORAL_TABLET | Freq: Two times a day (BID) | ORAL | 1 refills | Status: DC

## 2022-02-26 MED ORDER — SPIRONOLACTONE 25 MG PO TABS: 25.0000 mg | ORAL_TABLET | Freq: Two times a day (BID) | ORAL | 0 refills | Status: DC

## 2022-02-26 MED ORDER — ESCITALOPRAM OXALATE 10 MG PO TABS: 10.0000 mg | ORAL_TABLET | Freq: Every day | ORAL | 1 refills | Status: DC

## 2022-02-26 NOTE — Patient Instructions (Signed)
Stop HCTZ  Start spironolactone 25 mg twice daily.   After one week, start metformin 500 once daily. Then after one more week increase to twice daily metforming.  F/u in three months to repeat potassium level and lab work.   A referral was placed today again for endo, let's try this again Update me if you are having any issues with this referral.  Please let us know if you have not heard back within 2 weeks about the referral.   Regards,   Anshu Wehner FNP-C

## 2022-02-26 NOTE — Assessment & Plan Note (Signed)
Stop hctz start spironolactone for pcos

## 2022-02-26 NOTE — Assessment & Plan Note (Signed)
Referral placed for endo, a different endo than [facility] and [facility] as they would not accept previously.  Negative breast workup with neg dx mammo and u/s  Normal prolactin and thyroid  Slight elevation testosterone

## 2022-02-26 NOTE — Progress Notes (Signed)
Established Patient Office Visit  Subjective:  Patient ID: Angela Banks, female    DOB: May 10, 1986  Age: 36 y.o. MRN: [id number]  CC:  Chief Complaint  Patient presents with   Medication Consultation    HPI Angela Banks is here today for follow up.   Pt is with acute concerns.  Bil pedal edema, relief with HCTZ no longer with aching feeling in bil LE. She has had great relief. She is taking the HCTZ daily.   Anxiety/depression: doing well with lexapro 10 mg once daily. Doing well on this dose, seems to help with anxiety and stressors.   Pcos does suffer from hair on face and acne, increased water which has helped her significantly.   Bil nipple discharge, still bloody at times. She tried to see endo repro. But they recommended that instead she see endo. Still with some weight gain. Over the last two months she has increased her exercise routine, really seems to be plateauing. Works out everyday eats clean, and scale is not moving.  Wt Readings from Last 3 Encounters:  02/26/22 243 lb 4 oz (110.3 kg)  02/09/22 242 lb (109.8 kg)  10/30/21 258 lb (117 kg)     Past Medical History:  Diagnosis Date   Chicken pox    Fainting spell    Gasping for breath 08/24/2021   Mass of upper outer quadrant of right breast 08/24/2021   Migraine     Past Surgical History:  Procedure Laterality Date   BREAST BIOPSY Right 10/19/2021   [place] bx, path pending   CESAREAN SECTION  05/22/2007   tubal   CHOLECYSTECTOMY     VAGINAL DELIVERY      Family History  Problem Relation Age of Onset   Diabetes Mother    Ovarian cancer Mother        stage 3   Arthritis Maternal Grandmother    Heart disease Maternal Grandmother    Diabetes Maternal Grandmother    Kidney cancer Maternal Grandmother    Arthritis Maternal Grandfather    Heart disease Maternal Grandfather    Diabetes Maternal Grandfather    Colon cancer Paternal Grandmother    Uterine cancer Maternal Aunt     Social  History   Socioeconomic History   Marital status: Married    Spouse name: Not on file   Number of children: Not on file   Years of education: Not on file   Highest education level: Not on file  Occupational History   Not on file  Tobacco Use   Smoking status: Former    Packs/day: 0.25    Types: Cigarettes   Smokeless tobacco: Never  Substance and Sexual Activity   Alcohol use: No    Alcohol/week: 0.0 standard drinks of alcohol   Drug use: No   Sexual activity: Yes    Partners: Male    Birth control/protection: I.U.D.  Other Topics Concern   Not on file  Social History Narrative   Lives in [place] with husband and 2 children. 2 dogs in home.      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     Cyanocobalamin (VITAMIN B 12 PO) Take by mouth.     hydrochlorothiazide (HYDRODIURIL) 25 MG tablet Take 1 tablet (25 mg total) by mouth daily. 30 tablet 0   escitalopram (LEXAPRO) 10 MG tablet Take 1 tablet (10 mg total) by mouth daily. 90 tablet 1   No facility-administered medications prior to visit.    No Known Allergies      Objective:    Physical Exam Constitutional:      General: She is not in acute distress.    Appearance: Normal appearance. She is obese. She is not ill-appearing, toxic-appearing or diaphoretic.  Cardiovascular:     Rate and Rhythm: Normal rate and regular rhythm.  Pulmonary:     Effort: Pulmonary effort is normal.  Musculoskeletal:     Right lower leg: No edema.     Left lower  leg: No edema.  Neurological:     General: No focal deficit present.     Mental Status: She is alert and oriented to person, place, and time. Mental status is at baseline.  Psychiatric:        Mood and Affect: Mood normal.        Behavior: Behavior normal.        Thought Content: Thought content normal.        Judgment: Judgment normal.      BP 110/74   Pulse 74   Temp 98.6 F (37 C)   Resp 16   Ht 5\' 6"  (1.676 m)   Wt 243 lb 4 oz (110.3 kg)   SpO2 98%   BMI 39.26 kg/m  Wt Readings from Last 3 Encounters:  02/26/22 243 lb 4 oz (110.3 kg)  02/09/22 242 lb (109.8 kg)  10/30/21 258 lb (117 kg)     Health Maintenance Due  Topic Date Due   HIV Screening  Never done   Hepatitis C Screening  Never done   PAP SMEAR-Modifier  11/26/2010   TETANUS/TDAP  11/26/2019   COVID-19 Vaccine (2 - Pfizer series) 04/08/2020   INFLUENZA VACCINE  12/19/2021    There are no preventive care reminders to display for this patient.  Lab Results  Component Value Date   TSH 2.49 02/09/2022   Lab Results  Component Value Date   WBC 6.5 08/24/2021   HGB 14.1 08/24/2021   HCT 42.9 08/24/2021   MCV 90.4 08/24/2021   PLT 245.0 08/24/2021   Lab Results  Component Value Date   NA 138 02/09/2022   K 4.0 02/09/2022   CO2 26 02/09/2022   GLUCOSE 86 02/09/2022   BUN 14 02/09/2022   CREATININE 0.72 02/09/2022   BILITOT 0.4 02/09/2022   ALKPHOS 67 02/09/2022   AST 16 02/09/2022   ALT 27 02/09/2022   PROT 6.8 02/09/2022   ALBUMIN 4.2 02/09/2022   CALCIUM 9.7 02/09/2022   ANIONGAP 8 02/08/2012   GFR 107.53 02/09/2022   Lab Results  Component Value Date   CHOL 192 08/24/2021   Lab Results  Component Value Date   HDL 61.50 08/24/2021   Lab Results  Component Value Date   LDLCALC 115 (H) 08/24/2021   Lab Results  Component Value Date   TRIG 79.0 08/24/2021   Lab Results  Component Value Date   CHOLHDL 3 08/24/2021   Lab Results  Component Value Date   HGBA1C  5.9  08/24/2021      Assessment & Plan:   Problem List Items Addressed This Visit       Endocrine   PCOS (polycystic ovarian syndrome)    Advised pt to stop hctz  Start spironolactone 25 mg twice daily  Start metformin 500 mg po bid  F/u with gyn and endo Referral placed      Relevant Medications   spironolactone (ALDACTONE) 25 MG tablet   metFORMIN (GLUCOPHAGE) 500 MG tablet   Other Relevant Orders   Ambulatory referral to Endocrinology   POCT glycosylated hemoglobin (Hb A1C)     Other   Irregular menses   Relevant Orders   Ambulatory referral to Endocrinology   GAD (generalized anxiety disorder)   Relevant Medications   escitalopram (LEXAPRO) 10 MG tablet   Elevated testosterone level   Relevant Medications   spironolactone (ALDACTONE) 25 MG tablet   Other Relevant Orders   Ambulatory referral to Endocrinology   Pedal edema    Stop hctz start spironolactone for pcos      Relevant Orders   Ambulatory referral to Endocrinology   Nipple discharge in female - Primary    Referral placed for endo, a different endo than [facility] as they would not accept previously.  Negative breast workup with neg dx mammo and u/s  Normal prolactin and thyroid  Slight elevation testosterone      Relevant Orders   Ambulatory referral to Endocrinology   Other Visit Diagnoses     Abnormal weight gain       Relevant Medications   spironolactone (ALDACTONE) 25 MG tablet   metFORMIN (GLUCOPHAGE) 500 MG tablet   Other Relevant Orders   Ambulatory referral to Endocrinology       Meds ordered this encounter  Medications   escitalopram (LEXAPRO) 10 MG tablet    Sig: Take 1 tablet (10 mg total) by mouth daily.    Dispense:  90 tablet    Refill:  1   spironolactone (ALDACTONE) 25 MG tablet    Sig: Take 1 tablet (25 mg total) by mouth 2 (two) times daily.    Dispense:  180 tablet    Refill:  0    Order Specific Question:   Supervising Provider    Answer:   BEDSOLE, AMY  E [[id number]]   metFORMIN (GLUCOPHAGE) 500 MG tablet    Sig: Take 1 tablet (500 mg total) by mouth 2 (two) times daily with a meal.    Dispense:  180 tablet    Refill:  1    Order Specific Question:   Supervising Provider    Answer:   Ermalene Searing, AMY E [[id number]]    Follow-up: Return in about 3 months (around 05/29/2022) for f/u pcos .    Mort Sawyers, FNP

## 2022-02-26 NOTE — Assessment & Plan Note (Signed)
Advised pt to stop hctz  Start spironolactone 25 mg twice daily  Start metformin 500 mg po bid  F/u with gyn and endo Referral placed

## 2022-03-09 ENCOUNTER — Telehealth: Payer: Self-pay

## 2022-03-09 NOTE — Telephone Encounter (Signed)
Left message to return call to our office.  

## 2022-03-12 ENCOUNTER — Other Ambulatory Visit: Payer: Self-pay

## 2022-03-12 DIAGNOSIS — R600 Localized edema: Secondary | ICD-10-CM

## 2022-03-13 MED ORDER — HYDROCHLOROTHIAZIDE 25 MG PO TABS: 25.0000 mg | ORAL_TABLET | Freq: Every day | ORAL | 0 refills | Status: DC

## 2022-03-14 ENCOUNTER — Encounter: Payer: Self-pay | Admitting: Primary Care

## 2022-03-14 ENCOUNTER — Ambulatory Visit (INDEPENDENT_AMBULATORY_CARE_PROVIDER_SITE_OTHER): Payer: BC Managed Care – PPO | Admitting: Primary Care

## 2022-03-14 VITALS — BP 128/84 | HR 78 | Temp 98.1°F | Ht 66.0 in | Wt 239.2 lb

## 2022-03-14 DIAGNOSIS — R0683 Snoring: Secondary | ICD-10-CM

## 2022-03-14 NOTE — Assessment & Plan Note (Signed)
-   Patient has symptoms of loud snoring, restless/disrupted sleep and has woken herself up gasping for air. Associated daytime sleepiness- she does work third shift which could be contributing to fatigue but she has several other symptoms concerning for OSA. She needs home sleep study to evaluate for OSA. We reviewed risks of untreated sleep apnea including cardiac arrhthymias, pulmonary HTN, stroke and diabetes. We discussed treatment options including weight loss, oral appliance, CPAP or referral to ENT for possible surgical options. Encourage side sleeping position or elevating head at night while sleeping. Advised against driving if experiencing excessive daytime sleepiness. FU 1-2 weeks after sleep study to review results or treatment options.

## 2022-03-14 NOTE — Progress Notes (Signed)
Reviewed and agree with assessment/plan.   Daqwan Dougal, MD [facility] 03/14/2022, 4:52 PM Pager:  [phone]  

## 2022-03-14 NOTE — Progress Notes (Signed)
@Patient  ID: , female    DOB: 22-May-1985, 36 y.o.   MRN: [id number]  Chief Complaint  Patient presents with   Consult    Wakes up 3-4 times gasping for breathe. For 1 year. Husband says she snores. Daytime sleepiness.    Referring provider: [id number], FNP  HPI: 36 year old female, former smoker. PMH significant for vitamin D deficiency, fatigue and snoring.   03/14/2022 Patient presents today for sleep consult. She has symptoms of loud snoring, disrupted sleep, waking up gasping for air and daytime sleepiness. She has woken herself up several times at night gasping for air. She typically sleeps on her side. Her husband will wake her up when she is snoring loudly and encourage her to roll over onto her side. 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 On her off days she will try to maintain a normal sleep schedule. She has two boys ages 15 and 24. No sudden sleep attacks. No symptoms of narcolepsy, cataplexy or sleep walking.   Sleep questionnaire Symptoms- Loud snoring, disrupted sleep, waking up gasping for air and daytime sleepiness Prior sleep study- None  Bedtime- Work days bedtime is between 8am-10am Time to fall asleep- 2 mins  Nocturnal awakenings- 3-4 times Out of bed/start of day-  12-1 am Weight changes- lost 40 lbs  Do you operate heavy machinery- no Do you currently wear CPAP- no Do you current wear oxygen- no Epworth- 18  No Known Allergies  Immunization History  Administered Date(s) Administered   PFIZER(Purple Top)SARS-COV-2 Vaccination 02/12/2020   Tdap 11/25/2009    Past Medical History:  Diagnosis Date   Chicken pox    Fainting spell    Gasping for breath 08/24/2021   Mass of upper outer quadrant of right breast 08/24/2021   Migraine     Tobacco History: Social History   Tobacco Use  Smoking Status Former   Packs/day: 0.25   Types: Cigarettes  Smokeless Tobacco Never   Counseling given: Not Answered   Outpatient  Medications Prior to Visit  Medication Sig Dispense Refill   cholecalciferol (VITAMIN D3) 25 MCG (1000 UNIT) tablet Take 1,000 Units by mouth daily.     Cyanocobalamin (VITAMIN B 12 PO) Take by mouth.     escitalopram (LEXAPRO) 10 MG tablet Take 1 tablet (10 mg total) by mouth daily. 90 tablet 1   hydrochlorothiazide (HYDRODIURIL) 25 MG tablet Take 1 tablet (25 mg total) by mouth daily. 30 tablet 0   metFORMIN (GLUCOPHAGE) 500 MG tablet Take 1 tablet (500 mg total) by mouth 2 (two) times daily with a meal. 180 tablet 1   spironolactone (ALDACTONE) 25 MG tablet Take 1 tablet (25 mg total) by mouth 2 (two) times daily. 180 tablet 0   No facility-administered medications prior to visit.   Review of Systems  Review of Systems  Constitutional: Negative.   HENT: Negative.    Respiratory: Negative.    Cardiovascular: Negative.    Physical Exam  BP 128/84 (BP Location: Left Arm, Cuff Size: Normal)   Pulse 78   Temp 98.1 F (36.7 C)   Ht 5\' 6"  (1.676 m)   Wt 239 lb 3.2 oz (108.5 kg)   SpO2 96%   BMI 38.61 kg/m  Physical Exam Constitutional:      Appearance: Normal appearance.  HENT:     Head: Normocephalic and atraumatic.     Mouth/Throat:     Mouth: Mucous membranes are moist.     Pharynx: Oropharynx is  clear.     Comments: Mallampati class I Cardiovascular:     Rate and Rhythm: Normal rate and regular rhythm.  Pulmonary:     Effort: Pulmonary effort is normal.     Breath sounds: Normal breath sounds. No wheezing, rhonchi or rales.  Musculoskeletal:        General: Normal range of motion.  Skin:    General: Skin is warm and dry.  Neurological:     General: No focal deficit present.     Mental Status: She is alert and oriented to person, place, and time. Mental status is at baseline.  Psychiatric:        Mood and Affect: Mood normal.        Behavior: Behavior normal.        Thought Content: Thought content normal.        Judgment: Judgment normal.      Lab  Results:  CBC    Component Value Date/Time   WBC 6.5 08/24/2021 0946   RBC 4.74 08/24/2021 0946   HGB 14.1 08/24/2021 0946   HGB 13.6 10/08/2016 0805   HCT 42.9 08/24/2021 0946   HCT 42.8 10/08/2016 0805   PLT 245.0 08/24/2021 0946   PLT 258 10/08/2016 0805   MCV 90.4 08/24/2021 0946   MCV 93 10/08/2016 0805   MCV 94 02/08/2012 1133   MCH 29.6 10/08/2016 0805   MCH 31.4 02/08/2012 1133   MCHC 32.9 08/24/2021 0946   RDW 13.5 08/24/2021 0946   RDW 13.2 10/08/2016 0805   RDW 13.4 02/08/2012 1133   LYMPHSABS 2.0 08/24/2021 0946   LYMPHSABS 2.1 10/08/2016 0805   LYMPHSABS 2.1 02/08/2012 1133   MONOABS 0.5 08/24/2021 0946   MONOABS 0.7 02/08/2012 1133   EOSABS 0.1 08/24/2021 0946   EOSABS 0.2 10/08/2016 0805   EOSABS 0.1 02/08/2012 1133   BASOSABS 0.0 08/24/2021 0946   BASOSABS 0.0 10/08/2016 0805   BASOSABS 0.0 02/08/2012 1133    BMET    Component Value Date/Time   NA 138 02/09/2022 0902   NA 139 10/08/2016 0805   NA 139 02/08/2012 1133   K 4.0 02/09/2022 0902   K 4.0 02/08/2012 1133   CL 104 02/09/2022 0902   CL 103 02/08/2012 1133   CO2 26 02/09/2022 0902   CO2 28 02/08/2012 1133   GLUCOSE 86 02/09/2022 0902   GLUCOSE 85 02/08/2012 1133   BUN 14 02/09/2022 0902   BUN 10 10/08/2016 0805   BUN 13 02/08/2012 1133   CREATININE 0.72 02/09/2022 0902   CREATININE 0.80 02/08/2012 1133   CALCIUM 9.7 02/09/2022 0902   CALCIUM 9.6 02/08/2012 1133   GFRNONAA 103 10/08/2016 0805   GFRNONAA >60 02/08/2012 1133   GFRAA 119 10/08/2016 0805   GFRAA >60 02/08/2012 1133    BNP No results found for: "BNP"  ProBNP No results found for: "PROBNP"  Imaging: No results found.   Assessment & Plan:   Snoring - Patient has symptoms of loud snoring, restless/disrupted sleep and has woken herself up gasping for air. Associated daytime sleepiness- she does work third shift which could be contributing to fatigue but she has several other symptoms concerning for OSA. She needs  home sleep study to evaluate for OSA. We reviewed risks of untreated sleep apnea including cardiac arrhthymias, pulmonary HTN, stroke and diabetes. We discussed treatment options including weight loss, oral appliance, CPAP or referral to ENT for possible surgical options. Encourage side sleeping position or elevating head at night while sleeping. Advised  against driving if experiencing excessive daytime sleepiness. FU 1-2 weeks after sleep study to review results or treatment options.    Martyn Ehrich, NP 03/14/2022

## 2022-03-14 NOTE — Patient Instructions (Signed)
Sleep apnea is defined as period of 10 seconds or longer when you stop breathing at night. This can happen multiple times a night. Dx sleep apnea is when this occurs more than 5 times an hour.    Mild OSA 5-15 apneic events an hour Moderate OSA 15-30 apneic events an hour Severe OSA > 30 apneic events an hour   Untreated sleep apnea puts you at higher risk for cardiac arrhythmias, pulmonary HTN, stroke and diabetes  Treatment options include weight loss, side sleeping position, oral appliance, CPAP therapy or referral to ENT for possible surgical options    Recommendations: Focus on side sleeping position or elevate head with wedge pillow 30 degrees Work on weight loss efforts if able  Do not drive if experiencing excessive daytime sleepiness of fatigue    Orders: Home sleep study re: loud snoring    Follow-up: Please call to schedule follow-up 1-2 weeks after completing home sleep study to review results and treatment if needed (can be virtual)  Sleep Apnea Sleep apnea is a condition in which breathing pauses or becomes shallow during sleep. People with sleep apnea usually snore loudly. They may have times when they gasp and stop breathing for 10 seconds or more during sleep. This may happen many times during the night. Sleep apnea disrupts your sleep and keeps your body from getting the rest that it needs. This condition can increase your risk of certain health problems, including: Heart attack. Stroke. Obesity. Type 2 diabetes. Heart failure. Irregular heartbeat. High blood pressure. The goal of treatment is to help you breathe normally again. What are the causes?  The most common cause of sleep apnea is a collapsed or blocked airway. There are three kinds of sleep apnea: Obstructive sleep apnea. This kind is caused by a blocked or collapsed airway. Central sleep apnea. This kind happens when the part of the brain that controls breathing does not send the correct signals to  the muscles that control breathing. Mixed sleep apnea. This is a combination of obstructive and central sleep apnea. What increases the risk? You are more likely to develop this condition if you: Are overweight. Smoke. Have a smaller than normal airway. Are older. Are female. Drink alcohol. Take sedatives or tranquilizers. Have a family history of sleep apnea. Have a tongue or tonsils that are larger than normal. What are the signs or symptoms? Symptoms of this condition include: Trouble staying asleep. Loud snoring. Morning headaches. Waking up gasping. Dry mouth or sore throat in the morning. Daytime sleepiness and tiredness. If you have daytime fatigue because of sleep apnea, you may be more likely to have: Trouble concentrating. Forgetfulness. Irritability or mood swings. Personality changes. Feelings of depression. Sexual dysfunction. This may include loss of interest if you are female, or erectile dysfunction if you are female. How is this diagnosed? This condition may be diagnosed with: A medical history. A physical exam. A series of tests that are done while you are sleeping (sleep study). These tests are usually done in a sleep lab, but they may also be done at home. How is this treated? Treatment for this condition aims to restore normal breathing and to ease symptoms during sleep. It may involve managing health issues that can affect breathing, such as high blood pressure or obesity. Treatment may include: Sleeping on your side. Using a decongestant if you have nasal congestion. Avoiding the use of depressants, including alcohol, sedatives, and narcotics. Losing weight if you are overweight. Making changes to your diet.   Quitting smoking. Using a device to open your airway while you sleep, such as: An oral appliance. This is a custom-made mouthpiece that shifts your lower jaw forward. A continuous positive airway pressure (CPAP) device. This device blows air through a  mask when you breathe out (exhale). A nasal expiratory positive airway pressure (EPAP) device. This device has valves that you put into each nostril. A bi-level positive airway pressure (BIPAP) device. This device blows air through a mask when you breathe in (inhale) and breathe out (exhale). Having surgery if other treatments do not work. During surgery, excess tissue is removed to create a wider airway. Follow these instructions at home: Lifestyle Make any lifestyle changes that your health care provider recommends. Eat a healthy, well-balanced diet. Take steps to lose weight if you are overweight. Avoid using depressants, including alcohol, sedatives, and narcotics. Do not use any products that contain nicotine or tobacco. These products include cigarettes, chewing tobacco, and vaping devices, such as e-cigarettes. If you need help quitting, ask your health care provider. General instructions Take over-the-counter and prescription medicines only as told by your health care provider. If you were given a device to open your airway while you sleep, use it only as told by your health care provider. If you are having surgery, make sure to tell your health care provider you have sleep apnea. You may need to bring your device with you. Keep all follow-up visits. This is important. Contact a health care provider if: The device that you received to open your airway during sleep is uncomfortable or does not seem to be working. Your symptoms do not improve. Your symptoms get worse. Get help right away if: You develop: Chest pain. Shortness of breath. Discomfort in your back, arms, or stomach. You have: Trouble speaking. Weakness on one side of your body. Drooping in your face. These symptoms may represent a serious problem that is an emergency. Do not wait to see if the symptoms will go away. Get medical help right away. Call your local emergency services (911 in the [place] Do not drive yourself  to the hospital. Summary Sleep apnea is a condition in which breathing pauses or becomes shallow during sleep. The most common cause is a collapsed or blocked airway. The goal of treatment is to restore normal breathing and to ease symptoms during sleep. This information is not intended to replace advice given to you by your health care provider. Make sure you discuss any questions you have with your health care provider. Document Revised: 12/14/2020 Document Reviewed: 04/15/2020 Elsevier Patient Education  2023 Elsevier Inc.  

## 2022-03-23 DIAGNOSIS — F430 Acute stress reaction: Secondary | ICD-10-CM | POA: Insufficient documentation

## 2022-03-23 DIAGNOSIS — F410 Panic disorder [episodic paroxysmal anxiety] without agoraphobia: Secondary | ICD-10-CM | POA: Insufficient documentation

## 2022-03-23 MED ORDER — ESCITALOPRAM OXALATE 20 MG PO TABS: 20.0000 mg | ORAL_TABLET | Freq: Every day | ORAL | 0 refills | Status: DC

## 2022-03-23 MED ORDER — ALPRAZOLAM 0.25 MG PO TABS: 0.2500 mg | ORAL_TABLET | Freq: Two times a day (BID) | ORAL | 0 refills | Status: DC | PRN

## 2022-03-23 NOTE — Telephone Encounter (Signed)
Called and spoke with pt. Advised to increase lexapro to 20 mg once daily.  Pdmp reviewed, sent in xanax 0.25 mg to help with increased anxiety.

## 2022-03-23 NOTE — Telephone Encounter (Signed)
Referral is currently under review with [facility] - they are reviewing to see if they are able to see her.   Referral Notes Type Date User Summary Attachment  General 03/21/2022  8:33 AM Patrica Duel  -  Note: ----- Message ----- From: Patrica Duel Sent: 03/21/2022   8:33 AM EDT To: Cloyd Stagers, MD   please review

## 2022-03-23 NOTE — Telephone Encounter (Signed)
See 02/22/2022 Mychart Message  This has been an ongoing issue with trying to get this patient seen.

## 2022-04-06 ENCOUNTER — Encounter: Payer: Self-pay | Admitting: Family

## 2022-04-19 ENCOUNTER — Other Ambulatory Visit: Payer: Self-pay

## 2022-04-19 DIAGNOSIS — R600 Localized edema: Secondary | ICD-10-CM

## 2022-04-20 MED ORDER — HYDROCHLOROTHIAZIDE 25 MG PO TABS: 25.0000 mg | ORAL_TABLET | Freq: Every day | ORAL | 0 refills | Status: DC

## 2022-04-24 ENCOUNTER — Ambulatory Visit: Payer: BC Managed Care – PPO | Admitting: Primary Care

## 2022-04-24 DIAGNOSIS — R0683 Snoring: Secondary | ICD-10-CM

## 2022-04-24 DIAGNOSIS — G4733 Obstructive sleep apnea (adult) (pediatric): Secondary | ICD-10-CM

## 2022-05-04 DIAGNOSIS — G4733 Obstructive sleep apnea (adult) (pediatric): Secondary | ICD-10-CM | POA: Diagnosis not present

## 2022-05-08 NOTE — Progress Notes (Signed)
Please let patient know HST in December showed mild OSA, AHI 7.7/hr. Please set up visit to discuss results and treatments

## 2022-05-22 ENCOUNTER — Telehealth: Payer: BC Managed Care – PPO | Admitting: Primary Care

## 2022-05-25 ENCOUNTER — Telehealth: Payer: Self-pay

## 2022-05-25 DIAGNOSIS — E282 Polycystic ovarian syndrome: Secondary | ICD-10-CM

## 2022-05-25 DIAGNOSIS — R7989 Other specified abnormal findings of blood chemistry: Secondary | ICD-10-CM

## 2022-05-25 DIAGNOSIS — R635 Abnormal weight gain: Secondary | ICD-10-CM

## 2022-05-25 MED ORDER — SPIRONOLACTONE 25 MG PO TABS: 25.0000 mg | ORAL_TABLET | Freq: Two times a day (BID) | ORAL | 0 refills | Status: DC

## 2022-05-25 NOTE — Telephone Encounter (Signed)
Refilled

## 2022-05-25 NOTE — Telephone Encounter (Signed)
Received refill request for    

## 2022-05-25 NOTE — Addendum Note (Signed)
Addended by: Eugenia Pancoast on: 05/25/2022 04:22 PM   Modules accepted: Orders

## 2022-06-05 ENCOUNTER — Telehealth (INDEPENDENT_AMBULATORY_CARE_PROVIDER_SITE_OTHER): Payer: BC Managed Care – PPO | Admitting: Primary Care

## 2022-06-05 ENCOUNTER — Encounter: Payer: Self-pay | Admitting: Primary Care

## 2022-06-05 DIAGNOSIS — G4733 Obstructive sleep apnea (adult) (pediatric): Secondary | ICD-10-CM | POA: Diagnosis not present

## 2022-06-05 DIAGNOSIS — G4730 Sleep apnea, unspecified: Secondary | ICD-10-CM

## 2022-06-05 HISTORY — DX: Sleep apnea, unspecified: G47.30

## 2022-06-05 NOTE — Progress Notes (Signed)
Virtual Visit via Video Note  I connected with Angela Banks on 06/05/22 at  9:00 AM EST by a video enabled telemedicine application and verified that I am speaking with the correct person using two identifiers.  Location: Patient: Home Provider: Office    I discussed the limitations of evaluation and management by telemedicine and the availability of in person appointments. The patient expressed understanding and agreed to proceed.  History of Present Illness: 37 year old female, former smoker. PMH significant for vitamin D deficiency, fatigue and snoring.   Previous [facility] encounter:  03/14/2022 Patient presents today for sleep consult. She has symptoms of loud snoring, disrupted sleep, waking up gasping for air and daytime sleepiness. She has woken herself up several times at night gasping for air. She typically sleeps on her side. Her husband will wake her up when she is snoring loudly and encourage her to roll over onto her side. She works night shift as a Production designer, theatre/television/film. On her off days she will try to maintain a normal sleep schedule. She has two boys ages 55 and 48. No sudden sleep attacks. No symptoms of narcolepsy, cataplexy or sleep walking.   Sleep questionnaire Symptoms- Loud snoring, disrupted sleep, waking up gasping for air and daytime sleepiness Prior sleep study- None  Bedtime- Work days bedtime is between 8am-10am Time to fall asleep- 2 mins  Nocturnal awakenings- 3-4 times Out of bed/start of day-  12-1 am Weight changes- lost 40 lbs  Do you operate heavy machinery- no Do you currently wear CPAP- no Do you current wear oxygen- no Epworth- 18  06/05/2022- interim hx  Patient contacted today to review sleep study results HST 04/25/22 showed mild OSA, AHI 7.7/hr with SpO2 low 85% (average 94%) She has symptoms of snoring and daytime sleepiness. On occasion she wakes up gasping for air but not often.  Epworth score 18. BMI 38. Reviewed risks of untreated  sleep apnea and treatment options  Observations/Objective:  - Appears well; No overt shortness of breath or respiratory symptoms   Assessment and Plan:  Mild OSA: - HST 04/25/22 showed mild OSA, AHI 7.7/hr with SpO2 low 85% (average 94%) - Reviewed risk of untreated sleep apnea and treatment options. For now she has elected to manage conservatively/watchful waiting. Encourage side sleeping posision and weight loss. Advised against driving if experiencing excessive daytime sleepiness and avoid alcohol at bedtime. She will notify us if she wants to be referred for an oral appliance or CPAP.  Follow Up Instructions:   - Follow-up as needed  I discussed the assessment and treatment plan with the patient. The patient was provided an opportunity to ask questions and all were answered. The patient agreed with the plan and demonstrated an understanding of the instructions.   The patient was advised to call back or seek an in-person evaluation if the symptoms worsen or if the condition fails to improve as anticipated.  I provided 18 minutes of non-face-to-face time during this encounter.   Martyn Ehrich, NP

## 2022-06-06 NOTE — Progress Notes (Signed)
Reviewed and agree with assessment/plan.   Andriea Hasegawa, MD [facility] 06/06/2022, 10:36 AM Pager:  [phone]  

## 2022-08-27 ENCOUNTER — Telehealth: Payer: Self-pay

## 2022-08-27 NOTE — Telephone Encounter (Signed)
LVM to call back and sched

## 2022-08-27 NOTE — Telephone Encounter (Signed)
Patient has been scheduled

## 2022-08-27 NOTE — Telephone Encounter (Signed)
Received refill request on HCTZ 25 mg. Patient was due for follow up on 05/29/2022. Please call to set up. Let me know when done and I will send for refill.

## 2022-08-30 ENCOUNTER — Other Ambulatory Visit: Payer: Self-pay | Admitting: Family

## 2022-08-30 DIAGNOSIS — E282 Polycystic ovarian syndrome: Secondary | ICD-10-CM

## 2022-08-30 DIAGNOSIS — R635 Abnormal weight gain: Secondary | ICD-10-CM

## 2022-08-30 DIAGNOSIS — R7989 Other specified abnormal findings of blood chemistry: Secondary | ICD-10-CM

## 2022-08-30 NOTE — Telephone Encounter (Signed)
spironolactone (ALDACTONE) 25 MG tablet (Expired)   LR- 05/25/22 ( 180/ no refills) LV- 02/26/22 NV- 09/11/22

## 2022-09-05 ENCOUNTER — Other Ambulatory Visit: Payer: Self-pay | Admitting: Family

## 2022-09-05 DIAGNOSIS — Z1231 Encounter for screening mammogram for malignant neoplasm of breast: Secondary | ICD-10-CM

## 2022-09-11 ENCOUNTER — Telehealth: Payer: BC Managed Care – PPO | Admitting: Family

## 2022-09-11 ENCOUNTER — Encounter: Payer: Self-pay | Admitting: Family

## 2022-09-11 VITALS — BP 124/74 | HR 88 | Temp 98.1°F | Ht 66.0 in | Wt 231.6 lb

## 2022-09-11 DIAGNOSIS — R600 Localized edema: Secondary | ICD-10-CM

## 2022-09-11 DIAGNOSIS — F4323 Adjustment disorder with mixed anxiety and depressed mood: Secondary | ICD-10-CM | POA: Diagnosis not present

## 2022-09-11 DIAGNOSIS — N6452 Nipple discharge: Secondary | ICD-10-CM

## 2022-09-11 DIAGNOSIS — F410 Panic disorder [episodic paroxysmal anxiety] without agoraphobia: Secondary | ICD-10-CM

## 2022-09-11 DIAGNOSIS — R635 Abnormal weight gain: Secondary | ICD-10-CM | POA: Diagnosis not present

## 2022-09-11 DIAGNOSIS — E282 Polycystic ovarian syndrome: Secondary | ICD-10-CM

## 2022-09-11 DIAGNOSIS — F430 Acute stress reaction: Secondary | ICD-10-CM

## 2022-09-11 DIAGNOSIS — Z124 Encounter for screening for malignant neoplasm of cervix: Secondary | ICD-10-CM

## 2022-09-11 DIAGNOSIS — Z975 Presence of (intrauterine) contraceptive device: Secondary | ICD-10-CM

## 2022-09-11 DIAGNOSIS — F411 Generalized anxiety disorder: Secondary | ICD-10-CM

## 2022-09-11 MED ORDER — ALPRAZOLAM 0.25 MG PO TABS: 0.2500 mg | ORAL_TABLET | Freq: Two times a day (BID) | ORAL | 2 refills | Status: DC | PRN

## 2022-09-11 MED ORDER — HYDROCHLOROTHIAZIDE 25 MG PO TABS: 25.0000 mg | ORAL_TABLET | Freq: Every day | ORAL | 0 refills | Status: DC

## 2022-09-11 MED ORDER — METFORMIN HCL ER 500 MG PO TB24: 500.0000 mg | ORAL_TABLET | Freq: Two times a day (BID) | ORAL | 3 refills | Status: DC

## 2022-09-11 MED ORDER — SERTRALINE HCL 100 MG PO TABS: 100.0000 mg | ORAL_TABLET | Freq: Every day | ORAL | 3 refills | Status: DC

## 2022-09-11 NOTE — Progress Notes (Unsigned)
Established Patient Office Visit  Subjective:      CC:  Chief Complaint  Patient presents with   Medical Management of Chronic Issues    Needs referral to remove IUD    HPI: Angela Banks is a 37 y.o. female presenting on 09/11/2022 for Medical Management of Chronic Issues (Needs referral to remove IUD) .   New complaints: ***  Last visit with increasing anxiety/depression. Lexapro was increased to 20 mg and pt was given rx for xanax prn. Mom was in hospital and not doing well contributing to stress and worry.   Bil nipple discharge, still bloody at times. Occasional but not as bad as it was in the past. Diag mammogram with right breast mass, biopsied and biopsy was negative. A clip was placed. She has a repeat mammogram 09/26/22. Was referred to endo again however, again, she was told they do not handle galactorrhea.   Pcos: d/c hctz last visit and started on spironolactone 25 mg bid and started on metformin 500 mg po bid. Was advised to f/u with endo and gyn. Is taking metformin however does have diarrhea 2-3 times a day. Did stop the HCTZ because   Wt Readings from Last 3 Encounters:  09/11/22 231 lb 9.6 oz (105.1 kg)  03/14/22 239 lb 3.2 oz (108.5 kg)  02/26/22 243 lb 4 oz (110.3 kg)      Social history:  Relevant past medical, surgical, family and social history reviewed and updated as indicated. Interim medical history since our last visit reviewed.  Allergies and medications reviewed and updated.  DATA REVIEWED: CHART IN EPIC     ROS: Negative unless specifically indicated above in HPI.    Current Outpatient Medications:    ALPRAZolam (XANAX) 0.25 MG tablet, Take 1 tablet (0.25 mg total) by mouth 2 (two) times daily as needed for anxiety., Disp: 20 tablet, Rfl: 0   escitalopram (LEXAPRO) 20 MG tablet, Take 1 tablet (20 mg total) by mouth daily., Disp: 90 tablet, Rfl: 0   hydrochlorothiazide (HYDRODIURIL) 25 MG tablet, Take 1 tablet (25 mg total) by  mouth daily., Disp: 30 tablet, Rfl: 0   metFORMIN (GLUCOPHAGE) 500 MG tablet, Take 1 tablet (500 mg total) by mouth 2 (two) times daily with a meal., Disp: 180 tablet, Rfl: 1   spironolactone (ALDACTONE) 25 MG tablet, TAKE 1 TABLET(25 MG) BY MOUTH TWICE DAILY, Disp: 180 tablet, Rfl: 0      Objective:    BP 124/74 (BP Location: Left Arm)   Pulse 88   Temp 98.1 F (36.7 C) (Temporal)   Ht  (1.676 m)   Wt 231 lb 9.6 oz (105.1 kg)   SpO2 97%   BMI 37.38 kg/m   Wt Readings from Last 3 Encounters:  09/11/22 231 lb 9.6 oz (105.1 kg)  03/14/22 239 lb 3.2 oz (108.5 kg)  02/26/22 243 lb 4 oz (110.3 kg)    Physical Exam  Physical Exam Constitutional:      General: not in acute distress.    Appearance: Normal appearance. normal weight. is not ill-appearing, toxic-appearing or diaphoretic.  Cardiovascular:     Rate and Rhythm: Normal rate.  Pulmonary:     Effort: Pulmonary effort is normal.  Musculoskeletal:        General: Normal range of motion.  Neurological:     General: No focal deficit present.     Mental Status: alert and oriented to person, place, and time. Mental status is at baseline.  Psychiatric:  Mood and Affect: Mood normal.        Behavior: Behavior normal.        Thought Content: Thought content normal.        Judgment: Judgment normal.        Assessment & Plan:  Abnormal weight gain  PCOS (polycystic ovarian syndrome)  Pedal edema  GAD (generalized anxiety disorder)  Panic attack as reaction to stress     No follow-ups on file.  Mort Sawyers, MSN, APRN, FNP-C [facility]

## 2022-09-11 NOTE — Patient Instructions (Addendum)
  A referral was placed today for obgyn and also therapy.  Please let us know if you have not heard back within 2 weeks about the referral.  Stop lexapro, start sertraline 100 mg at nighttime.   Stop metformin. Change to metformin XR to see if you tolerate well.    Regards,   Mort Sawyers FNP-C

## 2022-09-12 DIAGNOSIS — Z975 Presence of (intrauterine) contraceptive device: Secondary | ICD-10-CM | POA: Insufficient documentation

## 2022-09-12 NOTE — Assessment & Plan Note (Signed)
Improving.

## 2022-09-12 NOTE — Assessment & Plan Note (Signed)
Referral placed for ob gyn as we do not remove IUD  Pt also due for pap which she will do once established care

## 2022-09-12 NOTE — Assessment & Plan Note (Signed)
Refill metformin 500 mg twice daily

## 2022-09-12 NOTE — Assessment & Plan Note (Signed)
Pdmp reviewed Refill for xanax

## 2022-09-12 NOTE — Assessment & Plan Note (Signed)
Stop lexapro  Start sertraline 100 mg once daily  Referral also placed for therapy  We will continue medication management at the office

## 2022-09-12 NOTE — Progress Notes (Signed)
Virtual Visit via Video note  I connected with Angela Banks on 09/12/22 is currently located at the office at [facility] family practice.  The provider, Mort Sawyers, FNP is located in their home at time of visit.  I discussed the limitations, risks, security and privacy concerns of performing an evaluation and management service by video and the availability of in person appointments. I also discussed with the patient that there may be a patient responsible charge related to this service. The patient expressed understanding and agreed to proceed.  Subjective: PCP: Mort Sawyers, FNP  Chief Complaint  Patient presents with   Medical Management of Chronic Issues    Needs referral to remove IUD    HPI  Last visit with increasing anxiety/depression. Lexapro was increased to 20 mg and pt was given rx for xanax prn. Mom was in hospital and not doing well contributing to stress and worry. Pt states she feels as though the lexapro is not helping her.  Bil nipple discharge, still bloody at times. Occasional but not as bad as it was in the past. Diag mammogram with right breast mass, biopsied and biopsy was negative. A clip was placed. She has a repeat mammogram 09/26/22. Was referred to endo again however, again, she was told they do not handle galactorrhea.   Pcos: d/c hctz last visit and started on spironolactone 25 mg bid and started on metformin 500 mg po bid. Was advised to f/u with endo and gyn. Is taking metformin however does have diarrhea 2-3 times a day. Did stop the HCTZ because   ROS: Per HPI  Current Outpatient Medications:    metFORMIN (GLUCOPHAGE-XR) 500 MG 24 hr tablet, Take 1 tablet (500 mg total) by mouth 2 (two) times daily with a meal., Disp: 180 tablet, Rfl: 3   sertraline (ZOLOFT) 100 MG tablet, Take 1 tablet (100 mg total) by mouth daily., Disp: 90 tablet, Rfl: 3   spironolactone (ALDACTONE) 25 MG tablet, TAKE 1 TABLET(25 MG) BY MOUTH TWICE DAILY, Disp: 180  tablet, Rfl: 0   ALPRAZolam (XANAX) 0.25 MG tablet, Take 1 tablet (0.25 mg total) by mouth 2 (two) times daily as needed for anxiety., Disp: 20 tablet, Rfl: 2   hydrochlorothiazide (HYDRODIURIL) 25 MG tablet, Take 1 tablet (25 mg total) by mouth daily., Disp: 30 tablet, Rfl: 0  Observations/Objective: Physical Exam Constitutional:      General: She is not in acute distress.    Appearance: Normal appearance. She is not ill-appearing.  Pulmonary:     Effort: Pulmonary effort is normal.  Neurological:     General: No focal deficit present.     Mental Status: She is alert and oriented to person, place, and time.  Psychiatric:        Mood and Affect: Mood normal. Affect is tearful.        Behavior: Behavior normal.        Thought Content: Thought content normal.     Assessment and Plan: Adjustment reaction with anxiety and depression -     Sertraline HCl; Take 1 tablet (100 mg total) by mouth daily.  Dispense: 90 tablet; Refill: 3 -     Ambulatory referral to Psychology  Abnormal weight gain  PCOS (polycystic ovarian syndrome) Assessment & Plan: Refill metformin 500 mg twice daily   Orders: -     metFORMIN HCl ER; Take 1 tablet (500 mg total) by mouth 2 (two) times daily with a meal.  Dispense: 180 tablet; Refill: 3  Pedal edema Assessment & Plan: Ok to continue hctz 25 mg once daily  Will continue to monitor potassium periodically   Orders: -     hydroCHLOROthiazide; Take 1 tablet (25 mg total) by mouth daily.  Dispense: 30 tablet; Refill: 0  GAD (generalized anxiety disorder) Assessment & Plan: Stop lexapro  Start sertraline 100 mg once daily  Referral also placed for therapy  We will continue medication management at the office  Orders: -     ALPRAZolam; Take 1 tablet (0.25 mg total) by mouth 2 (two) times daily as needed for anxiety.  Dispense: 20 tablet; Refill: 2 -     Sertraline HCl; Take 1 tablet (100 mg total) by mouth daily.  Dispense: 90 tablet; Refill: 3 -      Ambulatory referral to Psychology  Panic attack as reaction to stress Assessment & Plan: Pdmp reviewed Refill for xanax   Orders: -     ALPRAZolam; Take 1 tablet (0.25 mg total) by mouth 2 (two) times daily as needed for anxiety.  Dispense: 20 tablet; Refill: 2  IUD (intrauterine device) in place Assessment & Plan: Referral placed for ob gyn as we do not remove IUD  Pt also due for pap which she will do once established care  Orders: -     Ambulatory referral to Obstetrics / Gynecology  Screening for cervical cancer -     Ambulatory referral to Obstetrics / Gynecology  Nipple discharge, bloody Assessment & Plan: Improving      Follow Up Instructions: Return in about 6 weeks (around 10/23/2022) for f/u anxiety, f/u CPE.   I discussed the assessment and treatment plan with the patient. The patient was provided an opportunity to ask questions and all were answered. The patient agreed with the plan and demonstrated an understanding of the instructions.   The patient was advised to call back or seek an in-person evaluation if the symptoms worsen or if the condition fails to improve as anticipated.  The above assessment and management plan was discussed with the patient. The patient verbalized understanding of and has agreed to the management plan. Patient is aware to call the clinic if symptoms persist or worsen. Patient is aware when to return to the clinic for a follow-up visit. Patient educated on when it is appropriate to go to the emergency department.     I provided 35 minutes of face-to-face time during this encounter.   Mort Sawyers, MSN, APRN, FNP-C [facility]

## 2022-09-12 NOTE — Assessment & Plan Note (Signed)
Ok to continue hctz 25 mg once daily  Will continue to monitor potassium periodically

## 2022-09-26 ENCOUNTER — Ambulatory Visit
Admission: RE | Admit: 2022-09-26 | Discharge: 2022-09-26 | Disposition: A | Discharge status: Home / Self Care | Payer: BC Managed Care – PPO | Source: Ambulatory Visit | Attending: Family | Admitting: Family

## 2022-09-26 DIAGNOSIS — Z1231 Encounter for screening mammogram for malignant neoplasm of breast: Secondary | ICD-10-CM | POA: Diagnosis not present

## 2022-10-08 ENCOUNTER — Other Ambulatory Visit: Payer: Self-pay | Admitting: Family

## 2022-10-08 DIAGNOSIS — R600 Localized edema: Secondary | ICD-10-CM

## 2022-10-27 ENCOUNTER — Other Ambulatory Visit: Payer: Self-pay | Admitting: Family

## 2022-10-27 DIAGNOSIS — F411 Generalized anxiety disorder: Secondary | ICD-10-CM

## 2022-10-28 ENCOUNTER — Other Ambulatory Visit: Payer: Self-pay | Admitting: Family

## 2022-10-28 DIAGNOSIS — F411 Generalized anxiety disorder: Secondary | ICD-10-CM

## 2022-10-29 ENCOUNTER — Encounter: Payer: Self-pay | Admitting: Clinical

## 2022-10-29 ENCOUNTER — Encounter: Payer: BC Managed Care – PPO | Admitting: Family

## 2022-10-29 NOTE — Progress Notes (Signed)
                Alann Avey, LCSWThis encounter was created in error - please disregard. 

## 2022-11-07 ENCOUNTER — Other Ambulatory Visit: Payer: Self-pay | Admitting: Family

## 2022-11-07 DIAGNOSIS — F411 Generalized anxiety disorder: Secondary | ICD-10-CM

## 2022-12-07 ENCOUNTER — Other Ambulatory Visit: Payer: Self-pay | Admitting: *Deleted

## 2022-12-07 DIAGNOSIS — R635 Abnormal weight gain: Secondary | ICD-10-CM

## 2022-12-07 DIAGNOSIS — E282 Polycystic ovarian syndrome: Secondary | ICD-10-CM

## 2022-12-07 DIAGNOSIS — R7989 Other specified abnormal findings of blood chemistry: Secondary | ICD-10-CM

## 2022-12-07 MED ORDER — SPIRONOLACTONE 25 MG PO TABS: 25.0000 mg | ORAL_TABLET | Freq: Two times a day (BID) | ORAL | 3 refills | Status: DC

## 2023-02-05 ENCOUNTER — Encounter: Payer: Self-pay | Admitting: Obstetrics and Gynecology

## 2023-07-05 ENCOUNTER — Encounter: Payer: Self-pay | Admitting: Family

## 2023-07-05 ENCOUNTER — Other Ambulatory Visit (HOSPITAL_COMMUNITY): Payer: Self-pay

## 2023-07-05 ENCOUNTER — Ambulatory Visit (INDEPENDENT_AMBULATORY_CARE_PROVIDER_SITE_OTHER): Payer: 59 | Admitting: Family

## 2023-07-05 ENCOUNTER — Telehealth: Payer: Self-pay | Admitting: Pharmacy Technician

## 2023-07-05 VITALS — BP 122/76 | HR 88 | Temp 98.0°F | Ht 66.0 in | Wt 240.6 lb

## 2023-07-05 DIAGNOSIS — E538 Deficiency of other specified B group vitamins: Secondary | ICD-10-CM

## 2023-07-05 DIAGNOSIS — G4730 Sleep apnea, unspecified: Secondary | ICD-10-CM | POA: Diagnosis not present

## 2023-07-05 DIAGNOSIS — E559 Vitamin D deficiency, unspecified: Secondary | ICD-10-CM | POA: Diagnosis not present

## 2023-07-05 DIAGNOSIS — E282 Polycystic ovarian syndrome: Secondary | ICD-10-CM

## 2023-07-05 DIAGNOSIS — R600 Localized edema: Secondary | ICD-10-CM | POA: Diagnosis not present

## 2023-07-05 DIAGNOSIS — E7800 Pure hypercholesterolemia, unspecified: Secondary | ICD-10-CM | POA: Diagnosis not present

## 2023-07-05 DIAGNOSIS — Z85820 Personal history of malignant melanoma of skin: Secondary | ICD-10-CM | POA: Diagnosis not present

## 2023-07-05 DIAGNOSIS — F411 Generalized anxiety disorder: Secondary | ICD-10-CM

## 2023-07-05 DIAGNOSIS — E669 Obesity, unspecified: Secondary | ICD-10-CM

## 2023-07-05 LAB — TSH: TSH: 2.73 u[IU]/mL (ref 0.35–5.50)

## 2023-07-05 LAB — COMPREHENSIVE METABOLIC PANEL
ALT: 26 U/L (ref 0–35)
AST: 17 U/L (ref 0–37)
Albumin: 4.6 g/dL (ref 3.5–5.2)
Alkaline Phosphatase: 79 U/L (ref 39–117)
BUN: 12 mg/dL (ref 6–23)
CO2: 28 meq/L (ref 19–32)
Calcium: 9.4 mg/dL (ref 8.4–10.5)
Chloride: 104 meq/L (ref 96–112)
Creatinine, Ser: 0.7 mg/dL (ref 0.40–1.20)
GFR: 110.13 mL/min (ref >60.00)
Glucose, Bld: 88 mg/dL (ref 70–99)
Potassium: 4.8 meq/L (ref 3.5–5.1)
Sodium: 140 meq/L (ref 135–145)
Total Bilirubin: 0.5 mg/dL (ref 0.2–1.2)
Total Protein: 7.2 g/dL (ref 6.0–8.3)

## 2023-07-05 LAB — VITAMIN D 25 HYDROXY (VIT D DEFICIENCY, FRACTURES): VITD: 27.77 ng/mL — ABNORMAL LOW (ref 30.00–100.00)

## 2023-07-05 LAB — LIPID PANEL
Cholesterol: 194 mg/dL (ref 0–200)
HDL: 70.1 mg/dL (ref >39.00)
LDL Cholesterol: 106 mg/dL — ABNORMAL HIGH (ref 0–99)
NonHDL: 123.87
Total CHOL/HDL Ratio: 3
Triglycerides: 87 mg/dL (ref 0.0–149.0)
VLDL: 17.4 mg/dL (ref 0.0–40.0)

## 2023-07-05 LAB — VITAMIN B12: Vitamin B-12: 226 pg/mL (ref 211–911)

## 2023-07-05 LAB — HEMOGLOBIN A1C: Hgb A1c MFr Bld: 5.6 % (ref 4.6–6.5)

## 2023-07-05 MED ORDER — WEGOVY 1 MG/0.5ML ~~LOC~~ SOAJ: 1.0000 mg | SUBCUTANEOUS | 0 refills | Status: DC

## 2023-07-05 MED ORDER — WEGOVY 0.25 MG/0.5ML ~~LOC~~ SOAJ: SUBCUTANEOUS | 0 refills | Status: DC

## 2023-07-05 MED ORDER — WEGOVY 0.5 MG/0.5ML ~~LOC~~ SOAJ: SUBCUTANEOUS | 2 refills | Status: DC

## 2023-07-05 NOTE — Assessment & Plan Note (Signed)
F/u regularly with dermatology as scheduled

## 2023-07-05 NOTE — Assessment & Plan Note (Signed)
Doing well without medication.

## 2023-07-05 NOTE — Assessment & Plan Note (Signed)
Not currently on metformin will remain off for now.

## 2023-07-05 NOTE — Assessment & Plan Note (Signed)
Suspect situational Can wear compression stockings will check kidney function

## 2023-07-05 NOTE — Assessment & Plan Note (Signed)
Will work on diet exercise Trying to get glp 1 approved

## 2023-07-05 NOTE — Telephone Encounter (Signed)
Pharmacy Patient Advocate Encounter  Received notification from [facility] that Prior Authorization for [facility] 0.25MG /0.5ML auto-injectors  has been APPROVED from 07/05/2023 to 01/31/2024. Ran test claim, Copay is $0.00. This test claim was processed through [facility]- copay amounts may vary at other pharmacies due to pharmacy/plan contracts, or as the patient moves through the different stages of their insurance plan.   PA #/Case ID/Reference #:  [id number]

## 2023-07-05 NOTE — Telephone Encounter (Signed)
Pharmacy Patient Advocate Encounter   Received notification from  Onbase Portal that prior authorization for [facility] 0.25MG /0.5ML auto-injectors is required/requested.   Insurance verification completed.   The patient is insured through [facility] .   Per test claim: PA required; PA submitted to above mentioned insurance via CoverMyMeds Key/confirmation #/EOC [id number] Status is pending

## 2023-07-05 NOTE — Progress Notes (Signed)
Subjective:  Patient ID: Angela Banks, female    DOB: [birth year]  Age: 38 y.o. MRN: [id number]  Patient Care Team: Mort Sawyers, FNP as PCP - General (Family Medicine)   CC:  Chief Complaint  Patient presents with   Acute Visit    HPI Angela Banks is a 38 y.o. female who presents today for an follow up.   Pt is with acute concerns.   Switched jobs, no longer working night shift. She is working M-F 9-5 Doing better with sleep now as well because of it.   Mild sleep apnea: didn't start cpap pulmonary discussed with her that she could try to lose weight first.she does state weight is always a struggle point for her. She has PCOS as well.   Exercise: three days a week cardio x 30 min followed by strength training.   Diet:carnivore diet, has dropped 20 pounds since September however has plateau and no longer with any weight loss.   H/o melanoma, with dermatology had it resected on her back.  She then had another spot on her hip and this was also removed. She has f/u in may as another spot she is watching.  Anxiety, depression: was on sertraline 100 mg but she stopped them, and has not been taking them at all as she had ran out of her insurance.  Lost her MIL last year. Mom was fighting cancer as well.  She has had improvement though she notes since switching her jobs and the shift has been helpful.   PCOS was on metformin and spironolactone but has stopped.   Pedal edema: ongoing, and she was taking hydrochlorothiazide as needed but when she isn't taking this the fluid still comes back. She does sit at her job but also 'up and down all day'. She denies calf pain when she is walking. Eats a lot of steak. She is on carnivore diet.    DEPRESSION SCREENING    07/05/2023    8:59 AM 09/11/2022    8:43 AM 08/24/2021    9:43 AM 08/24/2021    9:20 AM  PHQ 2/9 Scores  PHQ - 2 Score 0 4 0 0  PHQ- 9 Score 0 14 7 7      ROS: Negative unless specifically indicated above in  HPI.    Current Outpatient Medications:    Semaglutide-Weight Management (WEGOVY) 0.25 MG/0.5ML SOAJ, Start 0.25 mg qweek for four weeks, then increase to 0.5 mg dose qweek, Disp: 2 mL, Rfl: 0   [START ON 07/19/2023] Semaglutide-Weight Management (WEGOVY) 0.5 MG/0.5ML SOAJ, After four weeks of 0.25 mg qweek, increase to 0.5 mg qweek, Disp: 2 mL, Rfl: 2   [START ON 08/16/2023] Semaglutide-Weight Management (WEGOVY) 1 MG/0.5ML SOAJ, Inject 1 mg into the skin once a week., Disp: 2 mL, Rfl: 0   ALPRAZolam (XANAX) 0.25 MG tablet, Take 1 tablet (0.25 mg total) by mouth 2 (two) times daily as needed for anxiety. (Patient not taking: Reported on 07/05/2023), Disp: 20 tablet, Rfl: 2    Objective:    BP 122/76 (BP Location: Left Arm, Patient Position: Sitting, Cuff Size: Large)   Pulse 88   Temp 98 F (36.7 C) (Temporal)   Ht 5\' 6"  (1.676 m)   Wt 240 lb 9.6 oz (109.1 kg)   LMP 06/20/2023 (Approximate)   SpO2 98%   BMI 38.83 kg/m   BP Readings from Last 3 Encounters:  07/05/23 122/76  09/11/22 124/74  03/14/22 128/84      Physical Exam Constitutional:  General: She is not in acute distress.    Appearance: Normal appearance. She is obese. She is not ill-appearing, toxic-appearing or diaphoretic.  HENT:     Head: Normocephalic.  Cardiovascular:     Rate and Rhythm: Normal rate and regular rhythm.  Pulmonary:     Effort: Pulmonary effort is normal.     Breath sounds: Normal breath sounds.  Musculoskeletal:        General: Normal range of motion.     Right lower leg: No edema.     Left lower leg: No edema.  Neurological:     General: No focal deficit present.     Mental Status: She is alert and oriented to person, place, and time. Mental status is at baseline.  Psychiatric:        Mood and Affect: Mood normal.        Behavior: Behavior normal.        Thought Content: Thought content normal.        Judgment: Judgment normal.          Assessment & Plan:  PCOS (polycystic  ovarian syndrome) Assessment & Plan: Not currently on metformin will remain off for now.    Orders: -     Wegovy; After four weeks of 0.25 mg qweek, increase to 0.5 mg qweek  Dispense: 2 mL; Refill: 2 -     Wegovy; Start 0.25 mg qweek for four weeks, then increase to 0.5 mg dose qweek  Dispense: 2 mL; Refill: 0 -     ZOXWRU; Inject 1 mg into the skin once a week.  Dispense: 2 mL; Refill: 0  Mild sleep apnea Assessment & Plan: Will work on diet exercise Trying to get glp 1 approved  Orders: -     EAVWUJ; After four weeks of 0.25 mg qweek, increase to 0.5 mg qweek  Dispense: 2 mL; Refill: 2 -     Wegovy; Start 0.25 mg qweek for four weeks, then increase to 0.5 mg dose qweek  Dispense: 2 mL; Refill: 0 -     WJXBJY; Inject 1 mg into the skin once a week.  Dispense: 2 mL; Refill: 0  History of melanoma Assessment & Plan: F/u regularly with dermatology as scheduled    Obesity (BMI 30-39.9) Assessment & Plan: Pt advised to work on diet and exercise as tolerated The beneficiary does not have any FDA labeled contraindications to the requested agent including pregnancy, lactation, h/o medullary thyroid cancer or multiple endocrine neoplasia type II.    Orders: -     TSH -     Wegovy; After four weeks of 0.25 mg qweek, increase to 0.5 mg qweek  Dispense: 2 mL; Refill: 2 -     Wegovy; Start 0.25 mg qweek for four weeks, then increase to 0.5 mg dose qweek  Dispense: 2 mL; Refill: 0 -     NWGNFA; Inject 1 mg into the skin once a week.  Dispense: 2 mL; Refill: 0 -     Hemoglobin A1c  Elevated LDL cholesterol level -     Lipid panel -     Wegovy; After four weeks of 0.25 mg qweek, increase to 0.5 mg qweek  Dispense: 2 mL; Refill: 2 -     Wegovy; Start 0.25 mg qweek for four weeks, then increase to 0.5 mg dose qweek  Dispense: 2 mL; Refill: 0  Vitamin D deficiency -     VITAMIN D 25 Hydroxy (Vit-D Deficiency, Fractures)  Vitamin B12 deficiency -  Vitamin B12  Pedal  edema Assessment & Plan: Suspect situational Can wear compression stockings will check kidney function   Orders: -     Comprehensive metabolic panel  GAD (generalized anxiety disorder) Assessment & Plan:  Doing well without medication        Follow-up: Return in about 3 months (around 10/02/2023) for f/u CPE.   Mort Sawyers, FNP

## 2023-07-05 NOTE — Assessment & Plan Note (Signed)
Pt advised to work on diet and exercise as tolerated The beneficiary does not have any FDA labeled contraindications to the requested agent including pregnancy, lactation, h/o medullary thyroid cancer or multiple endocrine neoplasia type II.

## 2023-07-08 ENCOUNTER — Other Ambulatory Visit: Payer: Self-pay | Admitting: Family

## 2023-07-08 ENCOUNTER — Encounter: Payer: Self-pay | Admitting: Family

## 2023-07-08 DIAGNOSIS — E559 Vitamin D deficiency, unspecified: Secondary | ICD-10-CM

## 2023-07-08 MED ORDER — CHOLECALCIFEROL 1.25 MG (50000 UT) PO TABS: 1.0000 | ORAL_TABLET | ORAL | 0 refills | Status: DC

## 2023-07-31 ENCOUNTER — Encounter: Payer: Self-pay | Admitting: Family

## 2023-08-29 ENCOUNTER — Other Ambulatory Visit: Payer: Self-pay | Admitting: Family

## 2023-08-29 DIAGNOSIS — Z1231 Encounter for screening mammogram for malignant neoplasm of breast: Secondary | ICD-10-CM

## 2023-09-25 ENCOUNTER — Encounter (HOSPITAL_COMMUNITY): Payer: Self-pay

## 2023-09-28 ENCOUNTER — Other Ambulatory Visit: Payer: Self-pay | Admitting: Medical Genetics

## 2023-09-30 ENCOUNTER — Other Ambulatory Visit

## 2023-10-02 ENCOUNTER — Encounter: Payer: Self-pay | Admitting: Family

## 2023-10-02 ENCOUNTER — Ambulatory Visit

## 2023-10-02 ENCOUNTER — Ambulatory Visit: Payer: 59 | Admitting: Family

## 2023-10-02 ENCOUNTER — Other Ambulatory Visit
Admission: RE | Admit: 2023-10-02 | Discharge: 2023-10-02 | Disposition: A | Discharge status: Home / Self Care | Source: Ambulatory Visit | Attending: Medical Genetics | Admitting: Medical Genetics

## 2023-10-02 VITALS — Ht 66.0 in

## 2023-10-02 DIAGNOSIS — Z8041 Family history of malignant neoplasm of ovary: Secondary | ICD-10-CM

## 2023-10-02 DIAGNOSIS — E538 Deficiency of other specified B group vitamins: Secondary | ICD-10-CM

## 2023-10-02 DIAGNOSIS — E669 Obesity, unspecified: Secondary | ICD-10-CM | POA: Diagnosis not present

## 2023-10-02 DIAGNOSIS — N6311 Unspecified lump in the right breast, upper outer quadrant: Secondary | ICD-10-CM

## 2023-10-02 MED ORDER — CYANOCOBALAMIN 1000 MCG/ML IJ SOLN
1000.0000 ug | Freq: Once | INTRAMUSCULAR | Status: AC
  Administered 2023-10-02: 1000 ug via INTRAMUSCULAR

## 2023-10-02 MED ORDER — WEGOVY 1.7 MG/0.75ML ~~LOC~~ SOAJ: 1.7000 mg | SUBCUTANEOUS | 0 refills | Status: DC

## 2023-10-02 NOTE — Assessment & Plan Note (Signed)
 B12 shot today  Could do 2 more shots once monthly Recommendation to start otc 1000 mg once daily b12

## 2023-10-02 NOTE — Assessment & Plan Note (Signed)
 Pt advised to work on diet and exercise as tolerated Increase wegovy  1.7 mg weekly

## 2023-10-02 NOTE — Progress Notes (Signed)
 Established Patient Office Visit  Subjective:   Patient ID: Angela Banks, female    DOB: 14-Sep-1985  Age: 38 y.o. MRN: [id number]  CC:  Chief Complaint  Patient presents with   Medical Management of Chronic Issues    Would like to increase Wegovy  dose    HPI: Angela Banks is a 38 y.o. female presenting on 10/02/2023 for Medical Management of Chronic Issues (Would like to increase Wegovy  dose)  Obesity: on wegovy  1 mg at current, not feeling any side effects. Had headaches initially but these have leveled out. She has noticed since being on the wegovy   Exercise: has been going on walks throughout the week and every other day tries to throw in some weights in the gym.   Wt Readings from Last 3 Encounters:  07/05/23 240 lb 9.6 oz (109.1 kg)  09/11/22 231 lb 9.6 oz (105.1 kg)  03/14/22 239 lb 3.2 oz (108.5 kg)   Vitamin b12 def: she has some over the counter medication at home but hasn't been consistent with taking it because she   Has noticed a tender breast mass upper outter near axillary space. She does have a screening mammogram ordered for today.       ROS: Negative unless specifically indicated above in HPI.   Relevant past medical history reviewed and updated as indicated.   Allergies and medications reviewed and updated.   Current Outpatient Medications:    Semaglutide -Weight Management (WEGOVY ) 1.7 MG/0.75ML SOAJ, Inject 1.7 mg into the skin once a week., Disp: 3 mL, Rfl: 0  No Known Allergies  Objective:   Ht 5\' 6"  (1.676 m)   BMI 38.83 kg/m    Physical Exam Vitals reviewed.  Constitutional:      General: She is not in acute distress.    Appearance: Normal appearance. She is normal weight. She is not ill-appearing, toxic-appearing or diaphoretic.  HENT:     Head: Normocephalic.  Cardiovascular:     Rate and Rhythm: Normal rate.  Pulmonary:     Effort: Pulmonary effort is normal.  Chest:  Breasts:    Right: Mass (tender mass upper outer  quadrant) present.  Musculoskeletal:        General: Normal range of motion.  Neurological:     General: No focal deficit present.     Mental Status: She is alert and oriented to person, place, and time. Mental status is at baseline.  Psychiatric:        Mood and Affect: Mood normal.        Behavior: Behavior normal.        Thought Content: Thought content normal.        Judgment: Judgment normal.     Assessment & Plan:  Obesity (BMI 30-39.9) Assessment & Plan: Pt advised to work on diet and exercise as tolerated Increase wegovy  1.7 mg weekly   Orders: -     Wegovy ; Inject 1.7 mg into the skin once a week.  Dispense: 3 mL; Refill: 0  Mass of upper outer quadrant of right breast Assessment & Plan: Diag mammogram and u/s breast ordered    Orders: -     MM 3D DIAGNOSTIC MAMMOGRAM BILATERAL BREAST; Future -     US  LIMITED ULTRASOUND INCLUDING AXILLA RIGHT BREAST; Future  Family history of ovarian cancer -     MM 3D DIAGNOSTIC MAMMOGRAM BILATERAL BREAST; Future -     US  LIMITED ULTRASOUND INCLUDING AXILLA RIGHT BREAST; Future  Vitamin B12 deficiency Assessment & Plan:  B12 shot today  Could do 2 more shots once monthly Recommendation to start otc 1000 mg once daily b12  Orders: -     Cyanocobalamin      Follow up plan: Return in about 6 months (around 04/03/2024) for f/u CPE.  Felicita Horns, FNP

## 2023-10-02 NOTE — Assessment & Plan Note (Signed)
 Diag mammogram and u/s breast ordered

## 2023-10-04 ENCOUNTER — Ambulatory Visit
Admission: RE | Admit: 2023-10-04 | Discharge: 2023-10-04 | Disposition: A | Discharge status: Home / Self Care | Source: Ambulatory Visit | Attending: Family | Admitting: Family

## 2023-10-04 DIAGNOSIS — Z8041 Family history of malignant neoplasm of ovary: Secondary | ICD-10-CM

## 2023-10-04 DIAGNOSIS — N6311 Unspecified lump in the right breast, upper outer quadrant: Secondary | ICD-10-CM | POA: Diagnosis present

## 2023-10-06 ENCOUNTER — Ambulatory Visit: Payer: Self-pay | Admitting: Family

## 2023-10-06 DIAGNOSIS — E669 Obesity, unspecified: Secondary | ICD-10-CM

## 2023-10-06 DIAGNOSIS — N6001 Solitary cyst of right breast: Secondary | ICD-10-CM

## 2023-10-06 DIAGNOSIS — N6452 Nipple discharge: Secondary | ICD-10-CM

## 2023-10-11 LAB — GENECONNECT MOLECULAR SCREEN: Genetic Analysis Overall Interpretation: NEGATIVE

## 2023-10-15 MED ORDER — WEGOVY 1.7 MG/0.75ML ~~LOC~~ SOAJ: 1.7000 mg | SUBCUTANEOUS | 0 refills | Status: DC

## 2023-10-15 NOTE — Addendum Note (Signed)
 Addended by: Felicita Horns on: 10/15/2023 08:00 AM   Modules accepted: Orders

## 2023-10-18 ENCOUNTER — Encounter: Payer: Self-pay | Admitting: *Deleted

## 2023-10-28 ENCOUNTER — Other Ambulatory Visit: Payer: Self-pay | Admitting: Family

## 2023-10-28 DIAGNOSIS — E669 Obesity, unspecified: Secondary | ICD-10-CM

## 2023-10-28 MED ORDER — WEGOVY 1.7 MG/0.75ML ~~LOC~~ SOAJ: 1.7000 mg | SUBCUTANEOUS | 0 refills | Status: DC

## 2023-12-24 ENCOUNTER — Encounter: Payer: Self-pay | Admitting: Family

## 2023-12-24 DIAGNOSIS — E669 Obesity, unspecified: Secondary | ICD-10-CM

## 2023-12-24 DIAGNOSIS — G4730 Sleep apnea, unspecified: Secondary | ICD-10-CM

## 2023-12-27 MED ORDER — WEGOVY 2.4 MG/0.75ML ~~LOC~~ SOAJ: 2.4000 mg | SUBCUTANEOUS | 2 refills | Status: DC

## 2023-12-27 NOTE — Telephone Encounter (Signed)
Order canceled. Thanks! °

## 2024-02-10 ENCOUNTER — Other Ambulatory Visit (HOSPITAL_COMMUNITY): Payer: Self-pay

## 2024-02-10 ENCOUNTER — Telehealth: Payer: Self-pay

## 2024-02-10 NOTE — Telephone Encounter (Signed)
 Pharmacy Patient Advocate [employer] companies are becoming increasingly stricter about requiring thorough documentation of lifestyle modifications in the patient's chart at each visit. This includes detailed records of diet recommendations (caloric intake, etc), exercise plans (amount of time/wk, etc), and an emphasis on the patient's commitment to continuing these efforts while on medication.  Without this additional documentation in the chart notes, a prior authorization will most likely be denied.  Will need new weight and BMI within 45 days for PA sunmission

## 2024-02-12 ENCOUNTER — Ambulatory Visit (INDEPENDENT_AMBULATORY_CARE_PROVIDER_SITE_OTHER): Admitting: Family

## 2024-02-12 ENCOUNTER — Other Ambulatory Visit (HOSPITAL_COMMUNITY): Payer: Self-pay

## 2024-02-12 VITALS — BP 130/78 | HR 80 | Temp 98.6°F | Ht 66.0 in | Wt 197.0 lb

## 2024-02-12 DIAGNOSIS — E6609 Other obesity due to excess calories: Secondary | ICD-10-CM

## 2024-02-12 DIAGNOSIS — E66811 Obesity, class 1: Secondary | ICD-10-CM

## 2024-02-12 DIAGNOSIS — Z6831 Body mass index (BMI) 31.0-31.9, adult: Secondary | ICD-10-CM

## 2024-02-12 NOTE — Telephone Encounter (Signed)
 Can we please re process prior auth with notes from today?  Updated per insurance recommendations

## 2024-02-12 NOTE — Progress Notes (Signed)
 Established Patient Office Visit  Subjective:      CC:  Chief Complaint  Patient presents with   Acute Visit    HPI: Angela Banks is a 38 y.o. female presenting on 02/12/2024 for Acute Visit .  Discussed the use of AI scribe software for clinical note transcription with the patient, who gave verbal consent to proceed.  History of Present Illness Angela Banks is a 38 year old female who presents for a weight check and prior authorization for Wegovy .  She is addressing prior authorization issues for her Wegovy  prescription, which requires documentation of her diet, caloric intake, and exercise regimen. She has lost 43 pounds and notes a significant reduction in food cravings and snacking.  She has experienced hair loss, which she believes was due to inadequate nutrition. After increasing her protein intake, she has noticed a reduction in hair loss. She takes over-the-counter Vitamin D  supplements due to consistently low levels and occasionally takes B12 gummies, although she has reduced her intake due to sleep disturbances. She uses B12 supplements when feeling lethargic.  She engages in regular physical activity, primarily cardio, and has recently incorporated weightlifting into her routine. She exercises about five days a week, typically for an hour each session, with a mix of cardio and weightlifting. She feels more energetic and motivated to be active, which has positively impacted her lifestyle.  She has a history of sleep apnea but does not use a CPAP machine. Her husband reports less frequent snoring. She underwent a sleep study with pulmonary specialists in the past.  She is not currently counting calories but notes that her meal portions have significantly decreased. She focuses on protein intake to support her exercise regimen and muscle maintenance.       Wt Readings from Last 3 Encounters:  02/12/24 197 lb (89.4 kg)  07/05/23 240 lb 9.6 oz (109.1 kg)   09/11/22 231 lb 9.6 oz (105.1 kg)         Social history:  Relevant past medical, surgical, family and social history reviewed and updated as indicated. Interim medical history since our last visit reviewed.  Allergies and medications reviewed and updated.  DATA REVIEWED: CHART IN EPIC     ROS: Negative unless specifically indicated above in HPI.    Current Outpatient Medications:    cholecalciferol  (VITAMIN D3) 25 MCG (1000 UNIT) tablet, Take 2,000 Units by mouth daily., Disp: , Rfl:    cyanocobalamin  (VITAMIN B12) 500 MCG tablet, Take 500 mcg by mouth daily., Disp: , Rfl:    semaglutide -weight management (WEGOVY ) 2.4 MG/0.75ML SOAJ SQ injection, Inject 2.4 mg into the skin once a week., Disp: 3 mL, Rfl: 2        Objective:        BP 130/78   Pulse 80   Temp 98.6 F (37 C) (Oral)   Ht 5' 6 (1.676 m)   Wt 197 lb (89.4 kg)   SpO2 99%   BMI 31.80 kg/m   Physical Exam MEASUREMENTS: Height- 5'6, BMI- 31.0.  Wt Readings from Last 3 Encounters:  02/12/24 197 lb (89.4 kg)  07/05/23 240 lb 9.6 oz (109.1 kg)  09/11/22 231 lb 9.6 oz (105.1 kg)    Physical Exam Constitutional:      General: She is not in acute distress.    Appearance: Normal appearance. She is obese. She is not ill-appearing.  HENT:     Head: Normocephalic.     Right Ear: Tympanic membrane normal.  Left Ear: Tympanic membrane normal.     Nose: Nose normal.     Mouth/Throat:     Mouth: Mucous membranes are moist.  Eyes:     Extraocular Movements: Extraocular movements intact.     Pupils: Pupils are equal, round, and reactive to light.  Cardiovascular:     Rate and Rhythm: Normal rate and regular rhythm.  Pulmonary:     Effort: Pulmonary effort is normal.     Breath sounds: Normal breath sounds.  Abdominal:     General: Abdomen is flat. Bowel sounds are normal.     Palpations: Abdomen is soft.     Tenderness: There is no guarding or rebound.  Musculoskeletal:        General:  Normal range of motion.     Cervical back: Normal range of motion.  Skin:    General: Skin is warm.     Capillary Refill: Capillary refill takes less than 2 seconds.  Neurological:     General: No focal deficit present.     Mental Status: She is alert.  Psychiatric:        Mood and Affect: Mood normal.        Behavior: Behavior normal.        Thought Content: Thought content normal.        Judgment: Judgment normal.          Results   Assessment & Plan:   Assessment and Plan Assessment & Plan Obesity (BMI 30-39.9) on GLP-1 therapy She has experienced significant weight loss, losing 43 pounds, and her BMI is now 31. Reports improved control over food cravings and reduced snacking. Incorporating more protein into her diet to address hair loss and engaging in regular exercise, including cardio and weightlifting. Insurance requires detailed records of diet, caloric intake, and exercise plan for prior authorization of Wegovy . - Submit prior authorization for Wegovy  with detailed records of diet, caloric intake, and exercise plan. - Continue current diet with increased protein intake. - Maintain regular exercise routine, including cardio and weightlifting.  Mild obstructive sleep apnea Has mild obstructive sleep apnea and was given the option of using a CPAP or losing weight. Husband reports reduced snoring, and significant weight loss may have improved her condition. Initially referred to pulmonary for a sleep study. - Consider re-evaluation of sleep apnea if symptoms persist or worsen.  Vitamin B12 deficiency Reports inconsistent use of B12 supplements due to sleep disturbances. Takes B12 gummies when feeling lethargic or low energy. Some people experience sleep disturbances with B12 supplementation. Consider B12 injections if levels remain low. - Consider B12 injections if B12 levels remain low.  Vitamin D  deficiency Taking over-the-counter Vitamin D  supplements as levels tend  to run low.  Recording duration: 8 minutes      Return in about 6 months (around 08/11/2024) for f/u CPE.     Ginger Patrick, MSN, APRN, FNP-C [facility]

## 2024-02-12 NOTE — Telephone Encounter (Signed)
 Pharmacy Patient Advocate Encounter   Received notification from Physician's Office that prior authorization for Wegovy  2.4 is required/requested.   Insurance verification completed.   The patient is insured through [facility] .   Per test claim: PA required; PA submitted to above mentioned insurance via Latent Key/confirmation #/EOC [id number] Status is pending

## 2024-02-12 NOTE — Telephone Encounter (Signed)
 Pharmacy Patient Advocate Encounter  Received notification from [employer] that Prior Authorization for Wegovy  2.4 has been APPROVED from 02/12/24 to 02/11/25 with QUANTITY LIMIT of 9.0 for a 63 day supply.   PA #/Case ID/Reference #: # [id number]

## 2024-03-10 ENCOUNTER — Other Ambulatory Visit: Payer: Self-pay | Admitting: *Deleted

## 2024-03-10 DIAGNOSIS — E669 Obesity, unspecified: Secondary | ICD-10-CM

## 2024-03-10 DIAGNOSIS — G4730 Sleep apnea, unspecified: Secondary | ICD-10-CM

## 2024-03-11 MED ORDER — WEGOVY 2.4 MG/0.75ML ~~LOC~~ SOAJ: 2.4000 mg | SUBCUTANEOUS | 2 refills | Status: AC

## 2024-06-01 ENCOUNTER — Other Ambulatory Visit: Payer: Self-pay | Admitting: *Deleted

## 2024-06-01 DIAGNOSIS — E669 Obesity, unspecified: Secondary | ICD-10-CM

## 2024-06-01 DIAGNOSIS — G4730 Sleep apnea, unspecified: Secondary | ICD-10-CM

## 2024-07-24 ENCOUNTER — Encounter: Admitting: Family

## 2025-01-01 ENCOUNTER — Encounter: Admitting: Family
